# Patient Record
Sex: Female | Born: 1970 | Race: White | Hispanic: No | Marital: Single | Smoking: Never smoker
Health system: Southern US, Community
[De-identification: ages and names within clinical notes are randomized; demographics above are authoritative.]

## PROBLEM LIST (undated history)

## (undated) DIAGNOSIS — E288 Other ovarian dysfunction: Secondary | ICD-10-CM

## (undated) DIAGNOSIS — M722 Plantar fascial fibromatosis: Secondary | ICD-10-CM

## (undated) DIAGNOSIS — E2839 Other primary ovarian failure: Secondary | ICD-10-CM

## (undated) HISTORY — DX: Other primary ovarian failure: E28.39

## (undated) HISTORY — DX: Other ovarian dysfunction: E28.8

## (undated) HISTORY — DX: Plantar fascial fibromatosis: M72.2

## (undated) HISTORY — PX: COLONOSCOPY W/ BIOPSIES: SHX1374

---

## 1999-07-04 ENCOUNTER — Other Ambulatory Visit: Admission: RE | Admit: 1999-07-04 | Discharge: 1999-07-04 | Payer: Self-pay | Admitting: Obstetrics and Gynecology

## 2001-07-04 ENCOUNTER — Other Ambulatory Visit: Admission: RE | Admit: 2001-07-04 | Discharge: 2001-07-04 | Payer: Self-pay | Admitting: Obstetrics and Gynecology

## 2005-12-01 ENCOUNTER — Emergency Department (HOSPITAL_COMMUNITY): Admission: EM | Admit: 2005-12-01 | Discharge: 2005-12-01 | Payer: Self-pay | Admitting: Emergency Medicine

## 2005-12-05 ENCOUNTER — Emergency Department (HOSPITAL_COMMUNITY): Admission: EM | Admit: 2005-12-05 | Discharge: 2005-12-05 | Payer: Self-pay | Admitting: Emergency Medicine

## 2005-12-08 ENCOUNTER — Emergency Department (HOSPITAL_COMMUNITY): Admission: EM | Admit: 2005-12-08 | Discharge: 2005-12-08 | Payer: Self-pay | Admitting: Emergency Medicine

## 2005-12-12 ENCOUNTER — Encounter (HOSPITAL_COMMUNITY): Admission: RE | Admit: 2005-12-12 | Discharge: 2006-03-12 | Payer: Self-pay | Admitting: Emergency Medicine

## 2006-02-05 ENCOUNTER — Other Ambulatory Visit: Admission: RE | Admit: 2006-02-05 | Discharge: 2006-02-05 | Payer: Self-pay | Admitting: Obstetrics and Gynecology

## 2007-12-04 ENCOUNTER — Other Ambulatory Visit: Admission: RE | Admit: 2007-12-04 | Discharge: 2007-12-04 | Payer: Self-pay | Admitting: Obstetrics and Gynecology

## 2010-10-24 ENCOUNTER — Ambulatory Visit (INDEPENDENT_AMBULATORY_CARE_PROVIDER_SITE_OTHER): Payer: 59 | Admitting: Obstetrics and Gynecology

## 2010-10-24 DIAGNOSIS — Z01419 Encounter for gynecological examination (general) (routine) without abnormal findings: Secondary | ICD-10-CM

## 2010-10-30 ENCOUNTER — Other Ambulatory Visit: Payer: Self-pay | Admitting: Obstetrics and Gynecology

## 2010-10-30 DIAGNOSIS — Z1231 Encounter for screening mammogram for malignant neoplasm of breast: Secondary | ICD-10-CM

## 2010-11-07 ENCOUNTER — Ambulatory Visit (HOSPITAL_COMMUNITY)
Admission: RE | Admit: 2010-11-07 | Discharge: 2010-11-07 | Disposition: A | Discharge status: Home / Self Care | Payer: 59 | Source: Ambulatory Visit | Attending: Obstetrics and Gynecology | Admitting: Obstetrics and Gynecology

## 2010-11-07 DIAGNOSIS — Z1231 Encounter for screening mammogram for malignant neoplasm of breast: Secondary | ICD-10-CM | POA: Insufficient documentation

## 2011-03-19 ENCOUNTER — Ambulatory Visit (INDEPENDENT_AMBULATORY_CARE_PROVIDER_SITE_OTHER): Payer: 59 | Admitting: Obstetrics and Gynecology

## 2011-03-19 DIAGNOSIS — B373 Candidiasis of vulva and vagina: Secondary | ICD-10-CM

## 2011-03-19 DIAGNOSIS — N912 Amenorrhea, unspecified: Secondary | ICD-10-CM

## 2011-05-02 ENCOUNTER — Ambulatory Visit (INDEPENDENT_AMBULATORY_CARE_PROVIDER_SITE_OTHER): Payer: 59 | Admitting: Gynecology

## 2011-05-02 ENCOUNTER — Other Ambulatory Visit: Payer: Self-pay

## 2011-05-02 DIAGNOSIS — Z13820 Encounter for screening for osteoporosis: Secondary | ICD-10-CM

## 2011-05-22 ENCOUNTER — Encounter: Payer: Self-pay | Admitting: Obstetrics and Gynecology

## 2011-06-19 ENCOUNTER — Telehealth: Payer: Self-pay | Admitting: *Deleted

## 2011-06-19 NOTE — Telephone Encounter (Signed)
Note written for the patient. It'll be scanned into record.

## 2011-06-19 NOTE — Telephone Encounter (Signed)
Patient called wants to say that she wants to get a chair that is better for her back.  Right now the chair she has is not giving her any support.  She has used an item to help with support in the chair but has been unsuccessful.  She continues to have back pain.  She said she doesn't believe that she has a disability and not necessary to see a specialist.  She said she is just trying to go through [employer] to get a better chair.

## 2011-10-09 ENCOUNTER — Other Ambulatory Visit: Payer: Self-pay | Admitting: Obstetrics and Gynecology

## 2011-10-09 DIAGNOSIS — Z1231 Encounter for screening mammogram for malignant neoplasm of breast: Secondary | ICD-10-CM

## 2011-10-26 ENCOUNTER — Other Ambulatory Visit (HOSPITAL_COMMUNITY)
Admission: RE | Admit: 2011-10-26 | Discharge: 2011-10-26 | Disposition: A | Discharge status: Home / Self Care | Payer: 59 | Source: Ambulatory Visit | Attending: Obstetrics and Gynecology | Admitting: Obstetrics and Gynecology

## 2011-10-26 ENCOUNTER — Ambulatory Visit (INDEPENDENT_AMBULATORY_CARE_PROVIDER_SITE_OTHER): Payer: 59 | Admitting: Obstetrics and Gynecology

## 2011-10-26 ENCOUNTER — Encounter: Payer: Self-pay | Admitting: Obstetrics and Gynecology

## 2011-10-26 VITALS — BP 110/70 | Ht 66.0 in | Wt 140.0 lb

## 2011-10-26 DIAGNOSIS — Z833 Family history of diabetes mellitus: Secondary | ICD-10-CM

## 2011-10-26 DIAGNOSIS — Z01419 Encounter for gynecological examination (general) (routine) without abnormal findings: Secondary | ICD-10-CM

## 2011-10-26 NOTE — Progress Notes (Signed)
Patient came to see me today for her annual GYN exam. She is now menopausal. She is not symptomatic enough to require HRT. She is up-to-date on her mammograms. She is not sexually active. She is having no vaginal bleeding. She is having no pelvic pain. She had a normal bone density last year.  Physical examination: HEENT within normal limits. Neck: Thyroid not large. No masses. Supraclavicular nodes: not enlarged. Breasts: Examined in both sitting midline position. No skin changes and no masses. Abdomen: Soft no guarding rebound or masses or hernia. Pelvic: External: Within normal limits. BUS: Within normal limits. Vaginal:within normal limits. Admits one finger breath snugly. Good estrogen effect. No evidence of cystocele rectocele or enterocele. Cervix: clean. Uterus: Normal size and shape. Adnexa: No masses. Rectovaginal exam: Confirmatory and negative. Extremities: Within normal limits.  Assessment: Normal GYN exam  Plan: Continue yearly mammograms

## 2011-10-27 LAB — URINALYSIS W MICROSCOPIC + REFLEX CULTURE
Crystals: NONE SEEN
Glucose, UA: NEGATIVE mg/dL
Protein, ur: NEGATIVE mg/dL
Specific Gravity, Urine: 1.013 (ref 1.005–1.030)

## 2011-10-28 LAB — URINE CULTURE
Colony Count: NO GROWTH
Organism ID, Bacteria: NO GROWTH

## 2011-11-08 ENCOUNTER — Ambulatory Visit (HOSPITAL_COMMUNITY)
Admission: RE | Admit: 2011-11-08 | Discharge: 2011-11-08 | Disposition: A | Discharge status: Home / Self Care | Payer: 59 | Source: Ambulatory Visit | Attending: Obstetrics and Gynecology | Admitting: Obstetrics and Gynecology

## 2011-11-08 DIAGNOSIS — Z1231 Encounter for screening mammogram for malignant neoplasm of breast: Secondary | ICD-10-CM | POA: Insufficient documentation

## 2013-05-06 ENCOUNTER — Other Ambulatory Visit: Payer: Self-pay | Admitting: Gynecology

## 2013-05-06 DIAGNOSIS — Z13820 Encounter for screening for osteoporosis: Secondary | ICD-10-CM

## 2013-06-11 ENCOUNTER — Ambulatory Visit (INDEPENDENT_AMBULATORY_CARE_PROVIDER_SITE_OTHER): Payer: 59 | Admitting: Gynecology

## 2013-06-11 ENCOUNTER — Encounter: Payer: Self-pay | Admitting: Gynecology

## 2013-06-11 VITALS — BP 112/74 | Ht 66.0 in | Wt 140.0 lb

## 2013-06-11 DIAGNOSIS — E288 Other ovarian dysfunction: Secondary | ICD-10-CM

## 2013-06-11 DIAGNOSIS — Z01419 Encounter for gynecological examination (general) (routine) without abnormal findings: Secondary | ICD-10-CM

## 2013-06-11 NOTE — Progress Notes (Signed)
Kari Powell Jan 31, 1971 [id number]        42 y.o.  G0P0 for annual exam.  Former patient of Dr. Eda Paschal. Several issues noted below.  Past medical history,surgical history, medications, allergies, family history and social history were all reviewed and documented in the EPIC chart.  ROS:  Performed and pertinent positives and negatives are included in the history, assessment and plan .  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 Pelvic  Ext/BUS/vagina  Normal with virginal introitus  Cervix  Normal to limited inspection Pap/HPV done.  Uterus  anteverted, normal size, shape and contour, midline and mobile nontender   Adnexa  Without masses or tenderness    Anus and perineum  normal   Rectovaginal  normal sphincter tone without palpated masses or tenderness.    Assessment/Plan:  42 y.o. G0P0 female for annual exam.   1. Premature menopause with no menses/bleeding. Virginal status. Exam normal. Reviewed issues of HRT particularly with premature menopause and the issues of cardioprotection and bone health.  The WHI study with increased risks of thrombosis such as stroke heart attack DVT and breast cancer reviewed. Different population studied and may not be applicable to premature menopause also discussed.  She is asymptomatic without hot flushes night sweats or vaginal dryness. Patient prefers not to take HRT at this time understanding the potential benefits as outlined above. 2. Pap smear 2013 normal but did not have endocervical cells. Pap/HPV repeated this year. 3. Mammography  scheduled next week. SBE monthly reviewed. 4. DEXA 2012 normal. Plan repeat at 48. Increase calcium vitamin D reviewed. 5. Health maintenance. Patient reports routine blood work done through screening program at work to include glucose lipid profile and other blood work. No blood work done today. Followup one year, sooner as needed.   Note: This document was prepared with digital dictation and possible smart phrase technology. Any transcriptional errors that result from this process are unintentional.   Kari Lords MD, 4:38 PM 06/11/2013

## 2013-06-11 NOTE — Patient Instructions (Signed)
followup in one year for annual exam. Sooner if any issues.

## 2013-06-12 LAB — URINALYSIS W MICROSCOPIC + REFLEX CULTURE
Casts: NONE SEEN
Glucose, UA: NEGATIVE mg/dL
Ketones, ur: NEGATIVE mg/dL
Nitrite: NEGATIVE
pH: 6 (ref 5.0–8.0)

## 2013-06-13 LAB — URINE CULTURE
Colony Count: NO GROWTH
Organism ID, Bacteria: NO GROWTH

## 2013-06-16 ENCOUNTER — Ambulatory Visit (INDEPENDENT_AMBULATORY_CARE_PROVIDER_SITE_OTHER): Payer: 59

## 2013-06-16 DIAGNOSIS — Z13820 Encounter for screening for osteoporosis: Secondary | ICD-10-CM

## 2013-07-09 ENCOUNTER — Other Ambulatory Visit: Payer: Self-pay

## 2014-06-01 ENCOUNTER — Other Ambulatory Visit: Payer: Self-pay | Admitting: Gynecology

## 2014-06-01 DIAGNOSIS — Z1231 Encounter for screening mammogram for malignant neoplasm of breast: Secondary | ICD-10-CM

## 2014-06-10 ENCOUNTER — Ambulatory Visit (HOSPITAL_COMMUNITY)
Admission: RE | Admit: 2014-06-10 | Discharge: 2014-06-10 | Disposition: A | Discharge status: Home / Self Care | Payer: 59 | Source: Ambulatory Visit | Attending: Gynecology | Admitting: Gynecology

## 2014-06-10 DIAGNOSIS — Z1231 Encounter for screening mammogram for malignant neoplasm of breast: Secondary | ICD-10-CM | POA: Insufficient documentation

## 2014-06-11 ENCOUNTER — Other Ambulatory Visit: Payer: Self-pay

## 2014-06-11 ENCOUNTER — Other Ambulatory Visit: Payer: Self-pay | Admitting: Gynecology

## 2014-06-11 DIAGNOSIS — R928 Other abnormal and inconclusive findings on diagnostic imaging of breast: Secondary | ICD-10-CM

## 2014-06-23 ENCOUNTER — Other Ambulatory Visit: Payer: Self-pay

## 2014-06-23 ENCOUNTER — Other Ambulatory Visit: Payer: Self-pay | Admitting: Gynecology

## 2014-06-23 DIAGNOSIS — R928 Other abnormal and inconclusive findings on diagnostic imaging of breast: Secondary | ICD-10-CM

## 2014-06-24 ENCOUNTER — Ambulatory Visit
Admission: RE | Admit: 2014-06-24 | Discharge: 2014-06-24 | Disposition: A | Discharge status: Home / Self Care | Payer: 59 | Source: Ambulatory Visit | Attending: Gynecology | Admitting: Gynecology

## 2014-06-24 DIAGNOSIS — R928 Other abnormal and inconclusive findings on diagnostic imaging of breast: Secondary | ICD-10-CM

## 2014-06-28 ENCOUNTER — Other Ambulatory Visit: Payer: 59

## 2014-07-13 ENCOUNTER — Ambulatory Visit (INDEPENDENT_AMBULATORY_CARE_PROVIDER_SITE_OTHER): Payer: 59 | Admitting: Gynecology

## 2014-07-13 ENCOUNTER — Encounter: Payer: Self-pay | Admitting: Gynecology

## 2014-07-13 VITALS — BP 112/70 | Ht 66.0 in | Wt 133.0 lb

## 2014-07-13 DIAGNOSIS — E28319 Asymptomatic premature menopause: Secondary | ICD-10-CM

## 2014-07-13 DIAGNOSIS — Z01419 Encounter for gynecological examination (general) (routine) without abnormal findings: Secondary | ICD-10-CM

## 2014-07-13 NOTE — Progress Notes (Signed)
Kari Powell Jun 06, 1971 [id number]        43 y.o.  G0P0 for annual exam.  Several issues noted below.  Past medical history,surgical history, problem list, medications, allergies, family history and social history were all reviewed and documented as reviewed in the EPIC chart.  ROS:  12 system ROS performed with pertinent positives and negatives included in the history, assessment and plan.   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 Skin: Grossly normal HEENT: Without gross lesions.  No cervical or supraclavicular adenopathy. Thyroid normal.  Lungs:  Clear without wheezing, rales or rhonchi Cardiac: RR, without RMG Abdominal:  Soft, nontender, without masses, guarding, rebound, organomegaly or hernia Breasts:  Examined lying and sitting without masses, retractions, discharge or axillary adenopathy. Pelvic:  Ext/BUS/vagina virginal admitting small finger only  Cervix difficult to visualize. Pap/HPV done  Uterus axial to anteverted, grossly normal size, shape and contour, midline and mobile nontender   Adnexa  Without masses or tenderness    Anus and perineum  Normal   Rectovaginal  Normal sphincter tone without palpated masses or tenderness.    Assessment/Plan:  43 y.o. G0P0 female for annual exam.   1. History of premature menopause several years ago per Dr. Verl Powell's workup. Patient without significant symptoms of hot flashes night sweats vaginal dryness. Remains virginal. Will recheck her FSH/TSH. Again rediscussed HRT and possible benefits in patients with premature menopause. Patient is not interested in starting and prefers just to monitor. No vaginal bleeding at all. Nose report any vaginal bleeding. 2. Pap smear last year returned inadequate. I did another Pap/HPV today. Exam is somewhat  limited by her virginal status and pain with exam. No gross abnormalities on exam. 3. Mammography 06/2014. Continue with annual mammography. SBE monthly reviewed. 4. DEXA 2014 normal. Plan repeat at 5-10 year interval. Increase calcium vitamin D reviewed MD level today. 5. Health maintenance. Baseline CBC comprehensive metabolic panel lipid profile urinalysis TSH FSH ordered.  Follow up if any issues.     Kari Powell,Kari Leiter P MD, 4:24 PM 07/13/2014

## 2014-07-13 NOTE — Addendum Note (Signed)
Addended by: Dayna BarkerGARDNER, Pleas Carneal K on: 07/13/2014 04:42 PM   Modules accepted: Orders

## 2014-07-13 NOTE — Patient Instructions (Signed)
You may obtain a copy of any labs that were done today by logging onto MyChart as outlined in the instructions provided with your AVS (after visit summary). The office will not call with normal lab results but certainly if there are any significant abnormalities then we will contact you.   Health Maintenance, Female A healthy lifestyle and preventative care can promote health and wellness.  Maintain regular health, dental, and eye exams.  Eat a healthy diet. Foods like vegetables, fruits, whole grains, low-fat dairy products, and lean protein foods contain the nutrients you need without too many calories. Decrease your intake of foods high in solid fats, added sugars, and salt. Get information about a proper diet from your caregiver, if necessary.  Regular physical exercise is one of the most important things you can do for your health. Most adults should get at least 150 minutes of moderate-intensity exercise (any activity that increases your heart rate and causes you to sweat) each week. In addition, most adults need muscle-strengthening exercises on 2 or more days a week.   Maintain a healthy weight. The body mass index (BMI) is a screening tool to identify possible weight problems. It provides an estimate of body fat based on height and weight. Your caregiver can help determine your BMI, and can help you achieve or maintain a healthy weight. For adults 20 years and older:  A BMI below 18.5 is considered underweight.  A BMI of 18.5 to 24.9 is normal.  A BMI of 25 to 29.9 is considered overweight.  A BMI of 30 and above is considered obese.  Maintain normal blood lipids and cholesterol by exercising and minimizing your intake of saturated fat. Eat a balanced diet with plenty of fruits and vegetables. Blood tests for lipids and cholesterol should begin at age 61 and be repeated every 5 years. If your lipid or cholesterol levels are high, you are over 50, or you are a high risk for heart  disease, you may need your cholesterol levels checked more frequently.Ongoing high lipid and cholesterol levels should be treated with medicines if diet and exercise are not effective.  If you smoke, find out from your caregiver how to quit. If you do not use tobacco, do not start.  Lung cancer screening is recommended for adults aged 33 80 years who are at high risk for developing lung cancer because of a history of smoking. Yearly low-dose computed tomography (CT) is recommended for people who have at least a 30-pack-year history of smoking and are a current smoker or have quit within the past 15 years. A pack year of smoking is smoking an average of 1 pack of cigarettes a day for 1 year (for example: 1 pack a day for 30 years or 2 packs a day for 15 years). Yearly screening should continue until the smoker has stopped smoking for at least 15 years. Yearly screening should also be stopped for people who develop a health problem that would prevent them from having lung cancer treatment.  If you are pregnant, do not drink alcohol. If you are breastfeeding, be very cautious about drinking alcohol. If you are not pregnant and choose to drink alcohol, do not exceed 1 drink per day. One drink is considered to be 12 ounces (355 mL) of beer, 5 ounces (148 mL) of wine, or 1.5 ounces (44 mL) of liquor.  Avoid use of street drugs. Do not share needles with anyone. Ask for help if you need support or instructions about stopping  the use of drugs.  High blood pressure causes heart disease and increases the risk of stroke. Blood pressure should be checked at least every 1 to 2 years. Ongoing high blood pressure should be treated with medicines, if weight loss and exercise are not effective.  If you are 59 to 45 years old, ask your caregiver if you should take aspirin to prevent strokes.  Diabetes screening involves taking a blood sample to check your fasting blood sugar level. This should be done once every 3  years, after age 91, if you are within normal weight and without risk factors for diabetes. Testing should be considered at a younger age or be carried out more frequently if you are overweight and have at least 1 risk factor for diabetes.  Breast cancer screening is essential preventative care for women. You should practice "breast self-awareness." This means understanding the normal appearance and feel of your breasts and may include breast self-examination. Any changes detected, no matter how small, should be reported to a caregiver. Women in their 66s and 30s should have a clinical breast exam (CBE) by a caregiver as part of a regular health exam every 1 to 3 years. After age 101, women should have a CBE every year. Starting at age 100, women should consider having a mammogram (breast X-ray) every year. Women who have a family history of breast cancer should talk to their caregiver about genetic screening. Women at a high risk of breast cancer should talk to their caregiver about having an MRI and a mammogram every year.  Breast cancer gene (BRCA)-related cancer risk assessment is recommended for women who have family members with BRCA-related cancers. BRCA-related cancers include breast, ovarian, tubal, and peritoneal cancers. Having family members with these cancers may be associated with an increased risk for harmful changes (mutations) in the breast cancer genes BRCA1 and BRCA2. Results of the assessment will determine the need for genetic counseling and BRCA1 and BRCA2 testing.  The Pap test is a screening test for cervical cancer. Women should have a Pap test starting at age 57. Between ages 25 and 35, Pap tests should be repeated every 2 years. Beginning at age 37, you should have a Pap test every 3 years as long as the past 3 Pap tests have been normal. If you had a hysterectomy for a problem that was not cancer or a condition that could lead to cancer, then you no longer need Pap tests. If you are  between ages 50 and 76, and you have had normal Pap tests going back 10 years, you no longer need Pap tests. If you have had past treatment for cervical cancer or a condition that could lead to cancer, you need Pap tests and screening for cancer for at least 20 years after your treatment. If Pap tests have been discontinued, risk factors (such as a new sexual partner) need to be reassessed to determine if screening should be resumed. Some women have medical problems that increase the chance of getting cervical cancer. In these cases, your caregiver may recommend more frequent screening and Pap tests.  The human papillomavirus (HPV) test is an additional test that may be used for cervical cancer screening. The HPV test looks for the virus that can cause the cell changes on the cervix. The cells collected during the Pap test can be tested for HPV. The HPV test could be used to screen women aged 44 years and older, and should be used in women of any age  who have unclear Pap test results. After the age of 55, women should have HPV testing at the same frequency as a Pap test.  Colorectal cancer can be detected and often prevented. Most routine colorectal cancer screening begins at the age of 44 and continues through age 20. However, your caregiver may recommend screening at an earlier age if you have risk factors for colon cancer. On a yearly basis, your caregiver may provide home test kits to check for hidden blood in the stool. Use of a small camera at the end of a tube, to directly examine the colon (sigmoidoscopy or colonoscopy), can detect the earliest forms of colorectal cancer. Talk to your caregiver about this at age 86, when routine screening begins. Direct examination of the colon should be repeated every 5 to 10 years through age 13, unless early forms of pre-cancerous polyps or small growths are found.  Hepatitis C blood testing is recommended for all people born from [birth year] through 1967 and any  individual with known risks for hepatitis C.  Practice safe sex. Use condoms and avoid high-risk sexual practices to reduce the spread of sexually transmitted infections (STIs). Sexually active women aged 36 and younger should be checked for Chlamydia, which is a common sexually transmitted infection. Older women with new or multiple partners should also be tested for Chlamydia. Testing for other STIs is recommended if you are sexually active and at increased risk.  Osteoporosis is a disease in which the bones lose minerals and strength with aging. This can result in serious bone fractures. The risk of osteoporosis can be identified using a bone density scan. Women ages 20 and over and women at risk for fractures or osteoporosis should discuss screening with their caregivers. Ask your caregiver whether you should be taking a calcium supplement or vitamin D to reduce the rate of osteoporosis.  Menopause can be associated with physical symptoms and risks. Hormone replacement therapy is available to decrease symptoms and risks. You should talk to your caregiver about whether hormone replacement therapy is right for you.  Use sunscreen. Apply sunscreen liberally and repeatedly throughout the day. You should seek shade when your shadow is shorter than you. Protect yourself by wearing long sleeves, pants, a wide-brimmed hat, and sunglasses year round, whenever you are outdoors.  Notify your caregiver of new moles or changes in moles, especially if there is a change in shape or color. Also notify your caregiver if a mole is larger than the size of a pencil eraser.  Stay current with your immunizations. Document Released: 03/05/2011 Document Revised: 12/15/2012 Document Reviewed: 03/05/2011 [facility] Patient Information 2014 [employer].

## 2014-07-16 ENCOUNTER — Other Ambulatory Visit: Payer: Self-pay | Admitting: Gynecology

## 2014-07-16 DIAGNOSIS — E7800 Pure hypercholesterolemia, unspecified: Secondary | ICD-10-CM

## 2014-07-16 DIAGNOSIS — R3129 Other microscopic hematuria: Secondary | ICD-10-CM

## 2014-07-19 ENCOUNTER — Other Ambulatory Visit: Payer: 59

## 2014-07-19 ENCOUNTER — Telehealth: Payer: Self-pay | Admitting: *Deleted

## 2014-07-19 NOTE — Telephone Encounter (Signed)
Pt called requesting a call back to get results on 07/13/14 I called and left the information Kari Powell spoke with patient as well on voicemail.

## 2014-07-20 ENCOUNTER — Other Ambulatory Visit: Payer: 59

## 2014-07-20 DIAGNOSIS — E7800 Pure hypercholesterolemia, unspecified: Secondary | ICD-10-CM

## 2014-07-20 DIAGNOSIS — R3129 Other microscopic hematuria: Secondary | ICD-10-CM

## 2014-07-20 LAB — LIPID PANEL
CHOL/HDL RATIO: 3.3 ratio
Cholesterol: 196 mg/dL (ref 0–200)
HDL: 59 mg/dL (ref >39)
LDL Cholesterol: 117 mg/dL — ABNORMAL HIGH (ref 0–99)
Triglycerides: 98 mg/dL (ref <150)
VLDL: 20 mg/dL (ref 0–40)

## 2014-07-21 LAB — URINALYSIS W MICROSCOPIC + REFLEX CULTURE
BILIRUBIN URINE: NEGATIVE
Bacteria, UA: NONE SEEN
CRYSTALS: NONE SEEN
Casts: NONE SEEN
GLUCOSE, UA: NEGATIVE mg/dL
Hgb urine dipstick: NEGATIVE
Ketones, ur: NEGATIVE mg/dL
Nitrite: NEGATIVE
Protein, ur: NEGATIVE mg/dL
SPECIFIC GRAVITY, URINE: 1.007 (ref 1.005–1.030)
SQUAMOUS EPITHELIAL / LPF: NONE SEEN
UROBILINOGEN UA: 0.2 mg/dL (ref 0.0–1.0)
pH: 7.5 (ref 5.0–8.0)

## 2014-07-22 LAB — URINE CULTURE: Colony Count: 30000

## 2014-07-23 ENCOUNTER — Other Ambulatory Visit: Payer: Self-pay | Admitting: Gynecology

## 2014-07-23 MED ORDER — CIPROFLOXACIN HCL 250 MG PO TABS: 250.0000 mg | ORAL_TABLET | Freq: Two times a day (BID) | ORAL | Status: DC

## 2014-07-26 ENCOUNTER — Other Ambulatory Visit: Payer: Self-pay | Admitting: Gynecology

## 2014-07-26 ENCOUNTER — Other Ambulatory Visit: Payer: 59

## 2014-07-26 ENCOUNTER — Telehealth: Payer: Self-pay

## 2014-07-26 DIAGNOSIS — N3000 Acute cystitis without hematuria: Secondary | ICD-10-CM

## 2014-07-26 LAB — URINALYSIS W MICROSCOPIC + REFLEX CULTURE
Bilirubin Urine: NEGATIVE
CASTS: NONE SEEN
CRYSTALS: NONE SEEN
GLUCOSE, UA: NEGATIVE mg/dL
Ketones, ur: NEGATIVE mg/dL
Leukocytes, UA: NEGATIVE
NITRITE: NEGATIVE
PH: 6 (ref 5.0–8.0)
Protein, ur: NEGATIVE mg/dL
Specific Gravity, Urine: <1.005 — ABNORMAL LOW (ref 1.005–1.030)
Urobilinogen, UA: 0.2 mg/dL (ref 0.0–1.0)

## 2014-07-26 NOTE — Telephone Encounter (Signed)
I would recommend repeating a clean-catch urine analysis and culture

## 2014-07-26 NOTE — Telephone Encounter (Signed)
Specifically order culture also

## 2014-07-26 NOTE — Telephone Encounter (Signed)
Left detailed message for patient to call to schedule lab appt. Order placed.

## 2014-07-26 NOTE — Telephone Encounter (Signed)
Patient said she took the Cipro for UTI found on u/a.  She said at the time she did not have symptoms but over the weekend while taking the medication she noticed a little bit of urgency andsome "discharge on my underwear".  Her biggest concern is that the Cipro has not treated the infection "and it could turn into something more serious". She wanted to see what you recommended?

## 2014-07-26 NOTE — Telephone Encounter (Signed)
Ordered accordingly. Checked with nicole.

## 2014-07-26 NOTE — Telephone Encounter (Signed)
Dr. Velvet BatheF- I ordered urinalysis with culture reflex.  Is that what you wanted or should I specifically order a urine culture.  Patient had called back stating she wanted a urine culture and sensitivity to be done.

## 2014-07-27 LAB — URINE CULTURE
COLONY COUNT: NO GROWTH
Organism ID, Bacteria: NO GROWTH

## 2014-07-28 ENCOUNTER — Telehealth: Payer: Self-pay

## 2014-07-28 NOTE — Telephone Encounter (Signed)
Patient repeated her u/a after taking antibiotic and wondered if results were final. Looks like culture was negative but u/a had "hgb urine dipstick=trace".  What to tell her?

## 2014-07-28 NOTE — Telephone Encounter (Signed)
Tell patient infection has cleared. Nothing further needs to be done

## 2014-07-28 NOTE — Telephone Encounter (Signed)
Left detailed message on her voice mail.

## 2014-12-14 ENCOUNTER — Telehealth: Payer: Self-pay | Admitting: *Deleted

## 2014-12-14 NOTE — Telephone Encounter (Signed)
[facility]

## 2014-12-14 NOTE — Telephone Encounter (Signed)
Left the below on voicemail with #

## 2014-12-14 NOTE — Telephone Encounter (Signed)
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 Pt would like recommendations for a provider to see for this. Please advise

## 2015-07-15 ENCOUNTER — Other Ambulatory Visit: Payer: Self-pay

## 2015-07-15 ENCOUNTER — Ambulatory Visit (INDEPENDENT_AMBULATORY_CARE_PROVIDER_SITE_OTHER): Payer: 59 | Admitting: Gynecology

## 2015-07-15 ENCOUNTER — Encounter: Payer: Self-pay | Admitting: Gynecology

## 2015-07-15 ENCOUNTER — Other Ambulatory Visit (HOSPITAL_COMMUNITY)
Admission: RE | Admit: 2015-07-15 | Discharge: 2015-07-15 | Disposition: A | Discharge status: Home / Self Care | Payer: 59 | Source: Ambulatory Visit | Attending: Gynecology | Admitting: Gynecology

## 2015-07-15 VITALS — BP 110/66 | Ht 66.0 in | Wt 132.0 lb

## 2015-07-15 DIAGNOSIS — E28319 Asymptomatic premature menopause: Secondary | ICD-10-CM

## 2015-07-15 DIAGNOSIS — Z1231 Encounter for screening mammogram for malignant neoplasm of breast: Secondary | ICD-10-CM

## 2015-07-15 DIAGNOSIS — Z01419 Encounter for gynecological examination (general) (routine) without abnormal findings: Secondary | ICD-10-CM | POA: Insufficient documentation

## 2015-07-15 NOTE — Patient Instructions (Signed)
Call to Schedule your mammogram  Facilities in [place]: 1)  [facility], [place] Phone: [phone] 2)  [facility]. [address] [place] Phone: [phone] 3)  Dr. Isaiah Blakes at [facility] [address] Phone: [phone]     Mammogram A mammogram is an X-ray test to find changes in a woman's breast. You should get a mammogram if:  You are 46 years of age or older  You have risk factors.   Your doctor recommends that you have one.  BEFORE THE TEST  Do not schedule the test the week before your period, especially if your breasts are sore during this time.  On the day of your mammogram:  Wash your breasts and armpits well. After washing, do not put on any deodorant or talcum powder on until after your test.   Eat and drink as you usually do.   Take your medicines as usual.   If you are diabetic and take insulin, make sure you:   Eat before coming for your test.   Take your insulin as usual.   If you cannot keep your appointment, call before the appointment to cancel. Schedule another appointment.  TEST  You will need to undress from the waist up. You will put on a hospital gown.   Your breast will be put on the mammogram machine, and it will press firmly on your breast with a piece of plastic called a compression paddle. This will make your breast flatter so that the machine can X-ray all parts of your breast.   Both breasts will be X-rayed. Each breast will be X-rayed from above and from the side. An X-ray might need to be taken again if the picture is not good enough.   The mammogram will last about 15 to 30 minutes.  AFTER THE TEST Finding out the results of your test Ask when your test results will be ready. Make sure you get your test results.  Document Released: 11/16/2008 Document Revised: 08/09/2011 Document Reviewed: 11/16/2008 [facility] Patient  Information 2012 [place].    You may obtain a copy of any labs that were done today by logging onto MyChart as outlined in the instructions provided with your AVS (after visit summary). The office will not call with normal lab results but certainly if there are any significant abnormalities then we will contact you.   Health Maintenance Adopting a healthy lifestyle and getting preventive care can go a long way to promote health and wellness. Talk with your health care provider about what schedule of regular examinations is right for you. This is a good chance for you to check in with your provider about disease prevention and staying healthy. In between checkups, there are plenty of things you can do on your own. Experts have done a lot of research about which lifestyle changes and preventive measures are most likely to keep you healthy. Ask your health care provider for more information. WEIGHT AND DIET  Eat a healthy diet  Be sure to include plenty of vegetables, fruits, low-fat dairy products, and lean protein.  Do not eat a lot of foods high in solid fats, added sugars, or salt.  Get regular exercise. This is one of the most important things you can do for your health. 1. Most adults should exercise for at least 150 minutes each week. The exercise should increase your heart rate and make you sweat (  moderate-intensity exercise). 2. Most adults should also do strengthening exercises at least twice a week. This is in addition to the moderate-intensity exercise.  Maintain a healthy weight  Body mass index (BMI) is a measurement that can be used to identify possible weight problems. It estimates body fat based on height and weight. Your health care provider can help determine your BMI and help you achieve or maintain a healthy weight.  For females 7 years of age and older:  1. A BMI below 18.5 is considered underweight. 2. A BMI of 18.5 to 24.9 is normal. 3. A BMI of 25 to 29.9 is  considered overweight. 4. A BMI of 30 and above is considered obese.  Watch levels of cholesterol and blood lipids  You should start having your blood tested for lipids and cholesterol at 46 years of age, then have this test every 5 years.  You may need to have your cholesterol levels checked more often if: 1. Your lipid or cholesterol levels are high. 2. You are older than 46 years of age. 3. You are at high risk for heart disease.  CANCER SCREENING   Lung Cancer  Lung cancer screening is recommended for adults 55-74 years old who are at high risk for lung cancer because of a history of smoking.  A yearly low-dose CT scan of the lungs is recommended for people who: 1. Currently smoke. 2. Have quit within the past 15 years. 3. Have at least a 30-pack-year history of smoking. A pack year is smoking an average of one pack of cigarettes a day for 1 year.  Yearly screening should continue until it has been 15 years since you quit.  Yearly screening should stop if you develop a health problem that would prevent you from having lung cancer treatment.  Breast Cancer  Practice breast self-awareness. This means understanding how your breasts normally appear and feel.  It also means doing regular breast self-exams. Let your health care provider know about any changes, no matter how small.  If you are in your 20s or 30s, you should have a clinical breast exam (CBE) by a health care provider every 1-3 years as part of a regular health exam.  If you are 1 or older, have a CBE every year. Also consider having a breast X-ray (mammogram) every year.  If you have a family history of breast cancer, talk to your health care provider about genetic screening.  If you are at high risk for breast cancer, talk to your health care provider about having an MRI and a mammogram every year.  Breast cancer gene (BRCA) assessment is recommended for women who have family members with BRCA-related cancers.  BRCA-related cancers include:  Breast.  Ovarian.  Tubal.  Peritoneal cancers.  Results of the assessment will determine the need for genetic counseling and BRCA1 and BRCA2 testing. Cervical Cancer Routine pelvic examinations to screen for cervical cancer are no longer recommended for nonpregnant women who are considered low risk for cancer of the pelvic organs (ovaries, uterus, and vagina) and who do not have symptoms. A pelvic examination may be necessary if you have symptoms including those associated with pelvic infections. Ask your health care provider if a screening pelvic exam is right for you.   The Pap test is the screening test for cervical cancer for women who are considered at risk.  If you had a hysterectomy for a problem that was not cancer or a condition that could lead to cancer, then you  no longer need Pap tests.  If you are older than 65 years, and you have had normal Pap tests for the past 10 years, you no longer need to have Pap tests.  If you have had past treatment for cervical cancer or a condition that could lead to cancer, you need Pap tests and screening for cancer for at least 20 years after your treatment.  If you no longer get a Pap test, assess your risk factors if they change (such as having a new sexual partner). This can affect whether you should start being screened again.  Some women have medical problems that increase their chance of getting cervical cancer. If this is the case for you, your health care provider may recommend more frequent screening and Pap tests.  The human papillomavirus (HPV) test is another test that may be used for cervical cancer screening. The HPV test looks for the virus that can cause cell changes in the cervix. The cells collected during the Pap test can be tested for HPV.  The HPV test can be used to screen women 58 years of age and older. Getting tested for HPV can extend the interval between normal Pap tests from three to  five years.  An HPV test also should be used to screen women of any age who have unclear Pap test results.  After 46 years of age, women should have HPV testing as often as Pap tests.  Colorectal Cancer  This type of cancer can be detected and often prevented.  Routine colorectal cancer screening usually begins at 46 years of age and continues through 46 years of age.  Your health care provider may recommend screening at an earlier age if you have risk factors for colon cancer.  Your health care provider may also recommend using home test kits to check for hidden blood in the stool.  A small camera at the end of a tube can be used to examine your colon directly (sigmoidoscopy or colonoscopy). This is done to check for the earliest forms of colorectal cancer.  Routine screening usually begins at age 60.  Direct examination of the colon should be repeated every 5-10 years through 46 years of age. However, you may need to be screened more often if early forms of precancerous polyps or small growths are found. Skin Cancer  Check your skin from head to toe regularly.  Tell your health care provider about any new moles or changes in moles, especially if there is a change in a mole's shape or color.  Also tell your health care provider if you have a mole that is larger than the size of a pencil eraser.  Always use sunscreen. Apply sunscreen liberally and repeatedly throughout the day.  Protect yourself by wearing long sleeves, pants, a wide-brimmed hat, and sunglasses whenever you are outside. HEART DISEASE, DIABETES, AND HIGH BLOOD PRESSURE   Have your blood pressure checked at least every 1-2 years. High blood pressure causes heart disease and increases the risk of stroke.  If you are between 49 years and 72 years old, ask your health care provider if you should take aspirin to prevent strokes.  Have regular diabetes screenings. This involves taking a blood sample to check your  fasting blood sugar level.  If you are at a normal weight and have a low risk for diabetes, have this test once every three years after 46 years of age.  If you are overweight and have a high risk for diabetes, consider  being tested at a younger age or more often. PREVENTING INFECTION  Hepatitis B  If you have a higher risk for hepatitis B, you should be screened for this virus. You are considered at high risk for hepatitis B if:  You were born in a country where hepatitis B is common. Ask your health care provider which countries are considered high risk.  Your parents were born in a high-risk country, and you have not been immunized against hepatitis B (hepatitis B vaccine).  You have HIV or AIDS.  You use needles to inject street drugs.  You live with someone who has hepatitis B.  You have had sex with someone who has hepatitis B.  You get hemodialysis treatment.  You take certain medicines for conditions, including cancer, organ transplantation, and autoimmune conditions. Hepatitis C  Blood testing is recommended for:  Everyone born from [birth year] through 1967.  Anyone with known risk factors for hepatitis C. Sexually transmitted infections (STIs)  You should be screened for sexually transmitted infections (STIs) including gonorrhea and chlamydia if:  You are sexually active and are younger than 46 years of age.  You are older than 46 years of age and your health care provider tells you that you are at risk for this type of infection.  Your sexual activity has changed since you were last screened and you are at an increased risk for chlamydia or gonorrhea. Ask your health care provider if you are at risk.  If you do not have HIV, but are at risk, it may be recommended that you take a prescription medicine daily to prevent HIV infection. This is called pre-exposure prophylaxis (PrEP). You are considered at risk if:  You are sexually active and do not regularly use condoms or  know the HIV status of your partner(s).  You take drugs by injection.  You are sexually active with a partner who has HIV. Talk with your health care provider about whether you are at high risk of being infected with HIV. If you choose to begin PrEP, you should first be tested for HIV. You should then be tested every 3 months for as long as you are taking PrEP.  PREGNANCY   If you are premenopausal and you may become pregnant, ask your health care provider about preconception counseling.  If you may become pregnant, take 400 to 800 micrograms (mcg) of folic acid every day.  If you want to prevent pregnancy, talk to your health care provider about birth control (contraception). OSTEOPOROSIS AND MENOPAUSE   Osteoporosis is a disease in which the bones lose minerals and strength with aging. This can result in serious bone fractures. Your risk for osteoporosis can be identified using a bone density scan.  If you are 43 years of age or older, or if you are at risk for osteoporosis and fractures, ask your health care provider if you should be screened.  Ask your health care provider whether you should take a calcium or vitamin D supplement to lower your risk for osteoporosis.  Menopause may have certain physical symptoms and risks.  Hormone replacement therapy may reduce some of these symptoms and risks. Talk to your health care provider about whether hormone replacement therapy is right for you.  HOME CARE INSTRUCTIONS   Schedule regular health, dental, and eye exams.  Stay current with your immunizations.   Do not use any tobacco products including cigarettes, chewing tobacco, or electronic cigarettes.  If you are pregnant, do not drink alcohol.  If  you are breastfeeding, limit how much and how often you drink alcohol.  Limit alcohol intake to no more than 1 drink per day for nonpregnant women. One drink equals 12 ounces of beer, 5 ounces of wine, or 1 ounces of hard liquor.  Do  not use street drugs.  Do not share needles.  Ask your health care provider for help if you need support or information about quitting drugs.  Tell your health care provider if you often feel depressed.  Tell your health care provider if you have ever been abused or do not feel safe at home. Document Released: 03/05/2011 Document Revised: 01/04/2014 Document Reviewed: 07/22/2013 [facility] Patient Information 2015 [place], [place]. This information is not intended to replace advice given to you by your health care provider. Make sure you discuss any questions you have with your health care provider.

## 2015-07-15 NOTE — Progress Notes (Signed)
Kari HeronRene M Powell 22-Apr-1971 [id number]        44 y.o.  G0P0  Patient's last menstrual period was 10/30/2010. for annual exam.  Doing well without complaints  Past medical history,surgical history, problem list, medications, allergies, family history and social history were all reviewed and documented as reviewed in the EPIC chart.  ROS:  Performed with pertinent positives and negatives included in the history, assessment and plan.   Additional significant findings :  none   Exam: [facility] personassistant Filed Vitals:   07/15/15 1559  BP: 110/66  Height: 5\' 6"  (1.676 m)  Weight: 132 lb (59.875 kg)   General appearance:  Normal affect, orientation and appearance. Skin: Grossly normal HEENT: Without gross lesions.  No cervical or supraclavicular adenopathy. Thyroid normal.  Lungs:  Clear without wheezing, rales or rhonchi Cardiac: RR, without RMG Abdominal:  Soft, nontender, without masses, guarding, rebound, organomegaly or hernia Breasts:  Examined lying and sitting without masses, retractions, discharge or axillary adenopathy. Pelvic:  Ext/BUS/vagina virginal admitting small finger only  Cervix difficult to visualize but no abnormalities. Pap smear done  Uterus axial to anteverted, normal size, shape and contour, midline and mobile nontender   Adnexa  Without masses or tenderness    Anus and perineum  Normal   Rectovaginal  Normal sphincter tone without palpated masses or tenderness.    Assessment/Plan:  44 y.o. G0P0 female for annual exam.   1. Premature menopause.patient underwent menopause several years ago per Dr. Verl DickerGottsegen's workup. Had discussed options for HRT but she has always declined and is not interesting. Not having significant hot flashes, night sweats, vaginal dryness. No vaginal bleeding. Continue to monitor and report any vaginal bleeding. 2. Pap smear/HPV 2015 negative. No endocervical cells. Pap smear done today. No history of abnormal Pap smears previously. 3. Mammography  due now and I reminded her to schedule when she agrees to do so.  SBE monthly reviewed 4. Health maintenance. No routine lab work done as patient reports this done through work health screening. Follow up in one year, sooner as needed   [name],Kari Araki P MD, 4:33 PM 07/15/2015

## 2015-07-15 NOTE — Addendum Note (Signed)
Addended by: Dayna BarkerGARDNER, Alfredo Collymore K on: 07/15/2015 04:51 PM   Modules accepted: Orders

## 2015-07-16 LAB — URINALYSIS W MICROSCOPIC + REFLEX CULTURE
Bacteria, UA: NONE SEEN [HPF]
Bilirubin Urine: NEGATIVE
CASTS: NONE SEEN [LPF]
Crystals: NONE SEEN [HPF]
Glucose, UA: NEGATIVE
Hgb urine dipstick: NEGATIVE
KETONES UR: NEGATIVE
NITRITE: NEGATIVE
PH: 7 (ref 5.0–8.0)
Protein, ur: NEGATIVE
RBC / HPF: NONE SEEN RBC/HPF (ref <=2)
SPECIFIC GRAVITY, URINE: 1.03 (ref 1.001–1.035)
Squamous Epithelial / LPF: NONE SEEN [HPF] (ref <=5)
Yeast: NONE SEEN [HPF]

## 2015-07-17 LAB — URINE CULTURE
COLONY COUNT: NO GROWTH
Organism ID, Bacteria: NO GROWTH

## 2015-07-19 LAB — CYTOLOGY - PAP

## 2015-08-05 ENCOUNTER — Ambulatory Visit
Admission: RE | Admit: 2015-08-05 | Discharge: 2015-08-05 | Disposition: A | Discharge status: Home / Self Care | Payer: 59 | Source: Ambulatory Visit

## 2015-08-05 DIAGNOSIS — Z1231 Encounter for screening mammogram for malignant neoplasm of breast: Secondary | ICD-10-CM

## 2016-07-19 ENCOUNTER — Other Ambulatory Visit: Payer: Self-pay | Admitting: Gynecology

## 2016-07-19 DIAGNOSIS — Z1231 Encounter for screening mammogram for malignant neoplasm of breast: Secondary | ICD-10-CM

## 2016-08-20 ENCOUNTER — Ambulatory Visit
Admission: RE | Admit: 2016-08-20 | Discharge: 2016-08-20 | Disposition: A | Discharge status: Home / Self Care | Payer: 59 | Source: Ambulatory Visit | Attending: Gynecology | Admitting: Gynecology

## 2016-08-20 DIAGNOSIS — Z1231 Encounter for screening mammogram for malignant neoplasm of breast: Secondary | ICD-10-CM

## 2016-09-26 ENCOUNTER — Encounter: Payer: Self-pay | Admitting: Gynecology

## 2016-09-26 ENCOUNTER — Ambulatory Visit (INDEPENDENT_AMBULATORY_CARE_PROVIDER_SITE_OTHER): Payer: 59 | Admitting: Gynecology

## 2016-09-26 VITALS — BP 110/70 | Ht 66.5 in | Wt 140.0 lb

## 2016-09-26 DIAGNOSIS — Z01419 Encounter for gynecological examination (general) (routine) without abnormal findings: Secondary | ICD-10-CM

## 2016-09-26 NOTE — Patient Instructions (Signed)

## 2016-09-26 NOTE — Progress Notes (Signed)
    Kari HeronRene M Powell 08/27/1971 [id number]        46 y.o.  G0P0 for annual exam.    Past medical history,surgical history, problem list, medications, allergies, family history and social history were all reviewed and documented as reviewed in the EPIC chart.  ROS:  Performed with pertinent positives and negatives included in the history, assessment and plan.   Additional significant findings :  None   Designer, multimediaBlanca  assistant Vitals:   09/26/16 0907  BP: 110/70  Weight: 140 lb (63.5 kg)  Height: 5' 6.5" (1.689 m)   Body mass index is 22.26 kg/m.  General appearance:  Normal affect, orientation and appearance. Skin: Grossly normal HEENT: Without gross lesions.  No cervical or supraclavicular adenopathy. Thyroid normal.  Lungs:  Clear without wheezing, rales or rhonchi Cardiac: RR, without RMG Abdominal:  Soft, nontender, without masses, guarding, rebound, organomegaly or hernia Breasts:  Examined lying and sitting without masses, retractions, discharge or axillary adenopathy. Pelvic:  Ext, BUS, Vagina  virginal, normal to exam  Cervix normal  Uterus anteverted, normal size, shape and contour, midline and mobile nontender   Adnexa without masses or tenderness    Anus and perineum normal   Rectovaginal normal sphincter tone without palpated masses or tenderness.    Assessment/Plan:  46 y.o. G0P0 female for annual exam without menses, abstinent birth control.  1. Premature menopause. Doing well without significant hot flushes, night sweats, vaginal dryness or any vaginal bleeding. Has been offered and reviewed HRT in the past and has always declined. Not interested in initiating. 2. Pap smear 2016. Pap smear/HPV 2015 negative. No Pap smear done today. No history of abnormal Pap smears previously. Plan repeat Pap smear at 5 year interval from her 2015 per current screening guidelines.  3. Mammography 08/2016. Continue with annual mammography when due. SBE monthly reviewed. 4. DEXA 2014  normal. Plan repeat DEXA at age 46 due to premature menopause. Increased calcium vitamin D. 5. Health maintenance. No routine lab work done as patient reports this done elsewhere.   [name],[name] P MD, 9:22 AM 09/26/2016

## 2017-08-21 ENCOUNTER — Ambulatory Visit
Admission: RE | Admit: 2017-08-21 | Discharge: 2017-08-21 | Disposition: A | Discharge status: Home / Self Care | Payer: 59 | Source: Ambulatory Visit | Attending: Gynecology | Admitting: Gynecology

## 2017-08-21 ENCOUNTER — Telehealth: Payer: Self-pay | Admitting: *Deleted

## 2017-08-21 ENCOUNTER — Other Ambulatory Visit: Payer: Self-pay | Admitting: Gynecology

## 2017-08-21 DIAGNOSIS — Z1231 Encounter for screening mammogram for malignant neoplasm of breast: Secondary | ICD-10-CM

## 2017-08-21 NOTE — Telephone Encounter (Signed)
Patient called and left message in voicemail requesting name of foot doctor regarding plantar fascitis. I called pt and received her voicemail and told her [facility] to call and schedule.

## 2017-09-02 ENCOUNTER — Ambulatory Visit: Payer: 59 | Admitting: Podiatry

## 2017-09-02 ENCOUNTER — Encounter: Payer: Self-pay | Admitting: Podiatry

## 2017-09-02 ENCOUNTER — Ambulatory Visit (INDEPENDENT_AMBULATORY_CARE_PROVIDER_SITE_OTHER): Payer: 59

## 2017-09-02 VITALS — BP 128/84

## 2017-09-02 DIAGNOSIS — M722 Plantar fascial fibromatosis: Secondary | ICD-10-CM | POA: Diagnosis not present

## 2017-09-02 MED ORDER — MELOXICAM 15 MG PO TABS
15.0000 mg | ORAL_TABLET | Freq: Every day | ORAL | 1 refills | Status: AC
Start: 2017-09-02 — End: 2017-10-02

## 2017-09-07 NOTE — Progress Notes (Signed)
   Subjective: 47 year old female is a new patient presents today for pain and tenderness in the right plantar heel that began over 3 months ago.  She states she has been taking naproxen with minimal relief. Patient states that it hurts in the mornings with the first steps out of bed. Patient presents today for further treatment and evaluation.   Past Medical History:  Diagnosis Date  . Premature ovarian failure      Objective: Physical Exam General: The patient is alert and oriented x3 in no acute distress.  Dermatology: Skin is warm, dry and supple bilateral lower extremities. Negative for open lesions or macerations bilateral.   Vascular: Dorsalis Pedis and Posterior Tibial pulses palpable bilateral.  Capillary fill time is immediate to all digits.  Neurological: Epicritic and protective threshold intact bilateral.   Musculoskeletal: Tenderness to palpation at the medial calcaneal tubercale and through the insertion of the plantar fascia of the right foot. All other joints range of motion within normal limits bilateral. Strength 5/5 in all groups bilateral.   Radiographic exam: Normal osseous mineralization. Joint spaces preserved. No fracture/dislocation/boney destruction. Calcaneal spur present with mild thickening of plantar fascia right. No other soft tissue abnormalities or radiopaque foreign bodies.   Assessment: 1. Plantar fasciitis right 2. Pain in right foot  Plan of Care:  1.  Patient evaluated. Xrays reviewed.   2.  Injection of 0.5cc Celestone soluspan injected into the right plantar fascia  3.  Prescription for meloxicam provided to patient. 4.  Plantar fascial brace dispensed. 5.  Return to clinic in 4 weeks.    Has a small gym at work.   Felecia ShellingBrent M. Alitza Cowman, DPM [facility]  Dr. Felecia ShellingBrent M. Momen Ham, DPM    [address]                                        [place], [place] [address]                Office [phone]  Fax [phone]  [phone]

## 2017-09-27 ENCOUNTER — Encounter: Payer: 59 | Admitting: Gynecology

## 2017-10-02 ENCOUNTER — Ambulatory Visit (INDEPENDENT_AMBULATORY_CARE_PROVIDER_SITE_OTHER): Payer: Managed Care, Other (non HMO) | Admitting: Podiatry

## 2017-10-02 ENCOUNTER — Encounter: Payer: Self-pay | Admitting: Podiatry

## 2017-10-02 DIAGNOSIS — M722 Plantar fascial fibromatosis: Secondary | ICD-10-CM | POA: Diagnosis not present

## 2017-10-03 ENCOUNTER — Ambulatory Visit (INDEPENDENT_AMBULATORY_CARE_PROVIDER_SITE_OTHER): Payer: Managed Care, Other (non HMO) | Admitting: Gynecology

## 2017-10-03 ENCOUNTER — Encounter: Payer: Self-pay | Admitting: Gynecology

## 2017-10-03 VITALS — BP 110/66 | Ht 66.0 in | Wt 126.8 lb

## 2017-10-03 DIAGNOSIS — Z01419 Encounter for gynecological examination (general) (routine) without abnormal findings: Secondary | ICD-10-CM

## 2017-10-03 NOTE — Patient Instructions (Signed)
Follow-up in 1 year for annual exam, sooner if any issues. 

## 2017-10-03 NOTE — Progress Notes (Signed)
    Kari HeronRene M Powell [birth year] [id number]        47 y.o.  G0P0 for annual gynecologic exam.  Without gynecologic complaints  Past medical history,surgical history, problem list, medications, allergies, family history and social history were all reviewed and documented as reviewed in the EPIC chart.  ROS:  Performed with pertinent positives and negatives included in the history, assessment and plan.   Additional significant findings : Normal   Exam: Kari PortelaKim Powell assistant Vitals:   10/03/17 0907  BP: 110/66  Weight: 126 lb 12.8 oz (57.5 kg)  Height: 5\' 6"  (1.676 m)   Body mass index is 20.47 kg/m.  General appearance:  Normal affect, orientation and appearance. Skin: Grossly normal HEENT: Without gross lesions.  No cervical or supraclavicular adenopathy. Thyroid normal.  Lungs:  Clear without wheezing, rales or rhonchi Cardiac: RR, without RMG Abdominal:  Soft, nontender, without masses, guarding, rebound, organomegaly or hernia Breasts:  Examined lying and sitting without masses, retractions, discharge or axillary adenopathy. Pelvic:  Ext, BUS, Vagina: Normal noting virginal status  Cervix: Normal  Uterus: Anteverted, normal size, shape and contour, midline and mobile nontender   Adnexa: Without masses or tenderness    Anus and perineum: Normal   Rectovaginal: Normal sphincter tone without palpated masses or tenderness.    Assessment/Plan:  47 y.o. G0P0 female for annual gynecologic exam.   1. Postmenopausal.  Underwent premature menopause.  No significant symptoms such as hot flashes or night sweats.  No vaginal bleeding.  Continue to monitor and report any issues or bleeding.  We have discussed HRT in the past and she has declined. 2. DEXA 2014 normal done due to premature menopause.  Plan repeat DEXA at age 47. 3. Mammography 08/2017.  Continue with annual mammography when due.  Breast exam normal today. 4. Pap smear 07/2015.  Pap smear/HPV 2015.  No Pap smear done today.   No history of abnormal Pap smears previously.  Plan repeat Pap smear at 5-year interval from her HPV Pap smear. 5. Health maintenance.  Patient reports routine lab work done elsewhere.  Follow-up in 1 year, sooner as needed.   Kari Lordsimothy P Jimmye Wisnieski MD, 9:29 AM 10/03/2017

## 2017-10-06 NOTE — Progress Notes (Signed)
   Subjective: 47 year old female presenting today for follow up evaluation of right plantar fasciitis. She reports continued pain although it has improved. She states wearing the fascial brace, taking Meloxicam and receiving the injection at the previous visit have all helped to alleviate the pain. Patient presents today for further treatment and evaluation.   Past Medical History:  Diagnosis Date  . Plantar fasciitis Right heel  . Premature ovarian failure      Objective: Physical Exam General: The patient is alert and oriented x3 in no acute distress.  Dermatology: Skin is warm, dry and supple bilateral lower extremities. Negative for open lesions or macerations bilateral.   Vascular: Dorsalis Pedis and Posterior Tibial pulses palpable bilateral.  Capillary fill time is immediate to all digits.  Neurological: Epicritic and protective threshold intact bilateral.   Musculoskeletal: Tenderness to palpation at the medial calcaneal tubercale and through the insertion of the plantar fascia of the right foot. All other joints range of motion within normal limits bilateral. Strength 5/5 in all groups bilateral.    Assessment: 1. Plantar fasciitis right - improved 2. Pain in right foot - improved  Plan of Care:  1.  Patient evaluated.  2.  Injection of 0.5cc Celestone soluspan injected into the right plantar fascia  3.  Continue wearing plantar fascial brace, taking Meloxicam, wearing good shoe gear and doing stretching exercises.  4.  Return to clinic as needed.   Has a small gym at work.   Felecia ShellingBrent M. Rodnesha Elie, DPM [facility]  Dr. Felecia ShellingBrent M. Trixy Loyola, DPM    [address]                                        , [place] [address]                Office [phone]  Fax [phone]

## 2017-12-31 ENCOUNTER — Telehealth: Payer: Self-pay | Admitting: Podiatry

## 2017-12-31 NOTE — Telephone Encounter (Signed)
This is Kari Powell and I've seen Dr. Logan Bores. I don't need to schedule an appointment, I just need some medical advice. I just want to know what to do for the plantar fasciitis of my right foot and now I have a bone spur. I was seen in December and January for an injection and medication. The pain hasn't gone away but its not unbearable. I tried doing exercises to stretch the plantar fasciitis. Is there something else I should be doing? I'm still wearing sneakers as its not sandal weather yet. I'm mostly staying off the treadmill but I've been using general walking exercises outside, leisurely walking. I think the injection has worn off and I'm no longer taking the medicine. I don't want to rely on medication the rest of my life or have an injection every month or two. Just wanted to know if this is ever going to go away and is this normal? Is there something I should be doing to relieve the plantar fasciitis? Right now I'm standing against a wall trying to stretch my foot and the plantar fasciitis. I'm also using a belt where I sit on my bed and pull my foot forward to stretch my heel. On occasion I'm rolling a tennis ball on my heel and sometimes I use a frozen water bottle instead, but its still making it difficult to walk. Is this pain normal and would it continue to come back? Is there any better stretching exercises I should be doing? You can give me a call back at [phone] and leave a message.

## 2017-12-31 NOTE — Telephone Encounter (Signed)
Left message informing pt that although plantar fasciitis could be chronic but did not mean she had to spend a lifetime on antiinflammatories or getting injections, that she should come in to discuss orthotics with Dr. Logan Bores, that would support the structures of the feet, that she should look at a track for cardio, treadmills did not allow people to use their regular gait and to avoid hills, and to continue the stretches, especially before an after exercise.

## 2018-11-10 ENCOUNTER — Other Ambulatory Visit: Payer: Self-pay | Admitting: Gynecology

## 2018-11-10 DIAGNOSIS — Z1231 Encounter for screening mammogram for malignant neoplasm of breast: Secondary | ICD-10-CM

## 2018-12-22 ENCOUNTER — Other Ambulatory Visit: Payer: Self-pay

## 2018-12-24 ENCOUNTER — Encounter: Payer: Self-pay | Admitting: Gynecology

## 2018-12-24 ENCOUNTER — Other Ambulatory Visit: Payer: Self-pay

## 2018-12-24 ENCOUNTER — Ambulatory Visit (INDEPENDENT_AMBULATORY_CARE_PROVIDER_SITE_OTHER): Payer: Managed Care, Other (non HMO) | Admitting: Gynecology

## 2018-12-24 VITALS — BP 118/74 | Ht 66.0 in | Wt 130.0 lb

## 2018-12-24 DIAGNOSIS — Z01419 Encounter for gynecological examination (general) (routine) without abnormal findings: Secondary | ICD-10-CM | POA: Diagnosis not present

## 2018-12-24 NOTE — Progress Notes (Signed)
    Kari Powell 21-May-1971 [id number]        48 y.o.  G0P0 for annual gynecologic exam.  Doing well without gynecologic complaints  Past medical history,surgical history, problem list, medications, allergies, family history and social history were all reviewed and documented as reviewed in the EPIC chart.  ROS:  Performed with pertinent positives and negatives included in the history, assessment and plan.   Additional significant findings : None   Exam: Kari Powell assistant Vitals:   12/24/18 0802  BP: 118/74  Weight: 130 lb (59 kg)  Height: 5\' 6"  (1.676 m)   Body mass index is 20.98 kg/m.  General appearance:  Normal affect, orientation and appearance. Skin: Grossly normal HEENT: Without gross lesions.  No cervical or supraclavicular adenopathy. Thyroid normal.  Lungs:  Clear without wheezing, rales or rhonchi Cardiac: RR, without RMG Abdominal:  Soft, nontender, without masses, guarding, rebound, organomegaly or hernia Breasts:  Examined lying and sitting without masses, retractions, discharge or axillary adenopathy. Pelvic:  Ext, BUS, Vagina: Virginal  Cervix: Normal  Uterus: Anteverted, normal size, shape and contour, midline and mobile nontender   Adnexa: Without masses or tenderness    Anus and perineum: Normal   Rectovaginal: Normal sphincter tone without palpated masses or tenderness.    Assessment/Plan:  48 y.o. G0P0 female for annual gynecologic exam.   1. Premature menopause.  No significant menopausal symptoms.  Had discussed HRT in the past and she declined. 2. Pap smear 2016.  Pap smear done today.  No history of abnormal Pap smears.  Negative HPV screen in the past 3. Mammography 2018.  Patient knows she needs to schedule a mammogram and will do so after the COVID restrictions lifted.  Breast exam normal today. 4. DEXA 2014 normal.  Plan repeat DEXA next year at age 48 due to history of premature menopause. 5. Health maintenance.  No routine lab work done  as patient does this elsewhere.  Follow-up 1 year, sooner as needed.   Kari Lords MD, 8:31 AM 12/24/2018

## 2018-12-24 NOTE — Patient Instructions (Signed)
Follow-up in 1 year for annual exam 

## 2018-12-24 NOTE — Addendum Note (Signed)
Addended by: Dayna Barker on: 12/24/2018 08:58 AM   Modules accepted: Orders

## 2018-12-25 LAB — PAP IG W/ RFLX HPV ASCU: PAP Smear Comment: 0

## 2019-01-13 ENCOUNTER — Other Ambulatory Visit: Payer: Self-pay

## 2019-01-13 ENCOUNTER — Ambulatory Visit
Admission: RE | Admit: 2019-01-13 | Discharge: 2019-01-13 | Disposition: A | Discharge status: Home / Self Care | Payer: Managed Care, Other (non HMO) | Source: Ambulatory Visit | Attending: Gynecology | Admitting: Gynecology

## 2019-01-13 DIAGNOSIS — Z1231 Encounter for screening mammogram for malignant neoplasm of breast: Secondary | ICD-10-CM

## 2019-05-27 ENCOUNTER — Encounter: Payer: Self-pay | Admitting: Gynecology

## 2019-09-03 DIAGNOSIS — Z0289 Encounter for other administrative examinations: Secondary | ICD-10-CM

## 2019-12-10 ENCOUNTER — Other Ambulatory Visit: Payer: Self-pay | Admitting: Obstetrics and Gynecology

## 2019-12-10 DIAGNOSIS — Z1231 Encounter for screening mammogram for malignant neoplasm of breast: Secondary | ICD-10-CM

## 2020-01-01 ENCOUNTER — Encounter: Payer: Managed Care, Other (non HMO) | Admitting: Obstetrics and Gynecology

## 2020-01-06 ENCOUNTER — Other Ambulatory Visit: Payer: Self-pay

## 2020-01-07 ENCOUNTER — Encounter: Payer: Self-pay | Admitting: Internal Medicine

## 2020-01-07 ENCOUNTER — Encounter: Payer: Self-pay | Admitting: Obstetrics and Gynecology

## 2020-01-07 ENCOUNTER — Ambulatory Visit (INDEPENDENT_AMBULATORY_CARE_PROVIDER_SITE_OTHER): Payer: Managed Care, Other (non HMO) | Admitting: Obstetrics and Gynecology

## 2020-01-07 ENCOUNTER — Telehealth: Payer: Self-pay | Admitting: *Deleted

## 2020-01-07 VITALS — BP 116/74 | Ht 66.0 in | Wt 131.0 lb

## 2020-01-07 DIAGNOSIS — Z780 Asymptomatic menopausal state: Secondary | ICD-10-CM

## 2020-01-07 DIAGNOSIS — Z01419 Encounter for gynecological examination (general) (routine) without abnormal findings: Secondary | ICD-10-CM | POA: Diagnosis not present

## 2020-01-07 DIAGNOSIS — Z13220 Encounter for screening for lipoid disorders: Secondary | ICD-10-CM | POA: Diagnosis not present

## 2020-01-07 DIAGNOSIS — Z13820 Encounter for screening for osteoporosis: Secondary | ICD-10-CM | POA: Diagnosis not present

## 2020-01-07 NOTE — Telephone Encounter (Signed)
I called patient and left detailed message on cell per DPR access. No referrals needed for [facility] or [facility] may call both to schedule.  I also relayed dexa order placed at this office and she can call appointments to schedule this when she is ready.

## 2020-01-07 NOTE — Telephone Encounter (Signed)
-----   Message from Keenan Bachelor, [place] sent at 01/07/2020  9:38 AM EDT ----- Regarding: FW: DEXA and PCP referral?  ----- Message ----- From: Theresia Majors, MD Sent: 01/07/2020   9:14 AM EDT To: [facility] Subject: DEXA and PCP referral?                         This patient was looking for a recommendation for primary care doctor and said something about needing a referral. She is also due for a repeat DEXA as she has premature menopause if we could please have her schedule this (ordered). I told her to just contact [facility] to set up a routine screening colonoscopy but she seemed to have questions about this too. Thank you.

## 2020-01-07 NOTE — Progress Notes (Addendum)
   Amparo Donalson Buster November 09, 1970 [id number]  SUBJECTIVE:  49 y.o. G0P0 female for annual routine gynecologic exam. She has no gynecologic concerns.  Current Outpatient Medications  Medication Sig Dispense Refill  . Ascorbic Acid (VITAMIN C PO) Take by mouth.    . Calcium Carbonate-Vitamin D (CALCIUM + D PO) Take by mouth.    . Cyanocobalamin (VITAMIN B-12 CR PO) Take by mouth.    Marland Kitchen FLUZONE QUADRIVALENT 0.5 ML injection ADM 0.5ML IM UTD  0  . Multiple Vitamin (MULTIVITAMIN) tablet Take 1 tablet by mouth daily.     No current facility-administered medications for this visit.   Allergies: Bee venom  Patient's last menstrual period was 10/30/2010.  Past medical history,surgical history, problem list, medications, allergies, family history and social history were all reviewed and documented as reviewed in the EPIC chart.  ROS:  Feeling well. No dyspnea or chest pain on exertion.  No abdominal pain, change in bowel habits, black or bloody stools.  No urinary tract symptoms. GYN ROS: no abnormal bleeding, pelvic pain or discharge, no breast pain or new or enlarging lumps on self exam. No neurological complaints.    OBJECTIVE:  BP 116/74   Ht 5\' 6"  (1.676 m)   Wt 131 lb (59.4 kg)   LMP 10/30/2010   BMI 21.14 kg/m  The patient appears well, alert, oriented x 3, in no distress. ENT normal.  Neck supple. No cervical or supraclavicular adenopathy or thyromegaly.  Lungs are clear, good air entry, no wheezes, rhonchi or rales. S1 and S2 normal, no murmurs, regular rate and rhythm.  Abdomen soft without tenderness, guarding, mass or organomegaly.  Neurological is normal, no focal findings.  BREAST EXAM: breasts appear normal, no suspicious masses, no skin or nipple changes or axillary nodes  PELVIC EXAM: VULVA: normal appearing vulva with no masses, tenderness or lesions, VAGINA: normal appearing vagina with normal color and discharge, no lesions, CERVIX: Limited exam due to patient  intolerance of speculum exam (pediatric speculum used and opened just a crack which is all she would tolerate), cervix palpates normal, UTERUS and ADNEXA: Limited exam due to patient intolerance of exam but no palpable masses or uterine enlargement Chaperone: 11/01/2010 present during the examination  ASSESSMENT:  49 y.o. G0P0 here for annual gynecologic exam  PLAN:   1. Premature menopause.  No symptoms of hot flashes or night sweats.  No vaginal bleeding.  She will let 44 know if any problems or symptoms occur.  I did also recommend that she follow through with previous recommendation to seek a primary doctor for any other health issues that arise outside of gynecologic concerns.  She requests that we have staff contact her to help her find a primary care doctor. 2. Pap smear 2020 (HPV test was not done).  No significant history of abnormal Pap smears.  Next Pap smear due 2023 following the current guidelines recommending the 3 year interval. 3. Mammogram 01/2019.  Normal breast exam today.  Next mammogram scheduled for 02/15/2020.   4. Colonoscopy is recommended at age 66, contacts are provided so she can get this scheduled.  5. DEXA 2014 normal.  Next DEXA was recommended this year as she was prematurely menopausal, so she plans to schedule this soon. 6. Health maintenance.  She will proceed to lab today for routine screening blood work (lipids, CBC, CMP, TSH, vitamin D level). Labs requested to go to [facility].  Return annually or sooner, prn.  44 MD 01/07/20

## 2020-01-08 LAB — CBC
Hematocrit: 40.6 % (ref 34.0–46.6)
Hemoglobin: 13.1 g/dL (ref 11.1–15.9)
MCH: 30 pg (ref 26.6–33.0)
MCHC: 32.3 g/dL (ref 31.5–35.7)
MCV: 93 fL (ref 79–97)
Platelets: 269 10*3/uL (ref 150–450)
RBC: 4.36 x10E6/uL (ref 3.77–5.28)
RDW: 12.2 % (ref 11.7–15.4)
WBC: 5 10*3/uL (ref 3.4–10.8)

## 2020-01-08 LAB — COMPREHENSIVE METABOLIC PANEL
ALT: 16 IU/L (ref 0–32)
AST: 17 IU/L (ref 0–40)
Albumin/Globulin Ratio: 1.7 (ref 1.2–2.2)
Albumin: 4.2 g/dL (ref 3.8–4.8)
Alkaline Phosphatase: 65 IU/L (ref 39–117)
BUN/Creatinine Ratio: 13 (ref 9–23)
BUN: 13 mg/dL (ref 6–24)
Bilirubin Total: <0.2 mg/dL (ref 0.0–1.2)
CO2: 26 mmol/L (ref 20–29)
Calcium: 9.5 mg/dL (ref 8.7–10.2)
Chloride: 105 mmol/L (ref 96–106)
Creatinine, Ser: 0.98 mg/dL (ref 0.57–1.00)
GFR calc Af Amer: 78 mL/min/{1.73_m2} (ref >59)
GFR calc non Af Amer: 67 mL/min/{1.73_m2} (ref >59)
Globulin, Total: 2.5 g/dL (ref 1.5–4.5)
Glucose: 82 mg/dL (ref 65–99)
Potassium: 4.1 mmol/L (ref 3.5–5.2)
Sodium: 144 mmol/L (ref 134–144)
Total Protein: 6.7 g/dL (ref 6.0–8.5)

## 2020-01-08 LAB — TSH: TSH: 1.51 u[IU]/mL (ref 0.450–4.500)

## 2020-01-08 LAB — LIPID PANEL
Chol/HDL Ratio: 3.6 ratio (ref 0.0–4.4)
Cholesterol, Total: 205 mg/dL — ABNORMAL HIGH (ref 100–199)
HDL: 57 mg/dL (ref >39)
LDL Chol Calc (NIH): 133 mg/dL — ABNORMAL HIGH (ref 0–99)
Triglycerides: 83 mg/dL (ref 0–149)
VLDL Cholesterol Cal: 15 mg/dL (ref 5–40)

## 2020-01-08 LAB — VITAMIN D 25 HYDROXY (VIT D DEFICIENCY, FRACTURES): Vit D, 25-Hydroxy: 60.1 ng/mL (ref 30.0–100.0)

## 2020-01-25 ENCOUNTER — Other Ambulatory Visit: Payer: Self-pay

## 2020-01-26 ENCOUNTER — Other Ambulatory Visit: Payer: Self-pay | Admitting: Obstetrics & Gynecology

## 2020-01-26 ENCOUNTER — Other Ambulatory Visit: Payer: Self-pay | Admitting: Obstetrics and Gynecology

## 2020-01-26 ENCOUNTER — Ambulatory Visit (INDEPENDENT_AMBULATORY_CARE_PROVIDER_SITE_OTHER): Payer: Managed Care, Other (non HMO)

## 2020-01-26 DIAGNOSIS — Z01419 Encounter for gynecological examination (general) (routine) without abnormal findings: Secondary | ICD-10-CM

## 2020-01-26 DIAGNOSIS — Z780 Asymptomatic menopausal state: Secondary | ICD-10-CM | POA: Diagnosis not present

## 2020-01-26 DIAGNOSIS — Z13820 Encounter for screening for osteoporosis: Secondary | ICD-10-CM

## 2020-02-15 ENCOUNTER — Other Ambulatory Visit: Payer: Self-pay

## 2020-02-15 ENCOUNTER — Ambulatory Visit
Admission: RE | Admit: 2020-02-15 | Discharge: 2020-02-15 | Disposition: A | Discharge status: Home / Self Care | Payer: Managed Care, Other (non HMO) | Source: Ambulatory Visit | Attending: Obstetrics and Gynecology | Admitting: Obstetrics and Gynecology

## 2020-02-15 DIAGNOSIS — Z1231 Encounter for screening mammogram for malignant neoplasm of breast: Secondary | ICD-10-CM

## 2020-02-16 ENCOUNTER — Ambulatory Visit (AMBULATORY_SURGERY_CENTER): Payer: Self-pay | Admitting: *Deleted

## 2020-02-16 ENCOUNTER — Other Ambulatory Visit: Payer: Self-pay

## 2020-02-16 VITALS — Ht 66.0 in | Wt 132.0 lb

## 2020-02-16 DIAGNOSIS — Z1211 Encounter for screening for malignant neoplasm of colon: Secondary | ICD-10-CM

## 2020-02-16 NOTE — Progress Notes (Signed)

## 2020-02-22 ENCOUNTER — Encounter: Payer: Self-pay | Admitting: Internal Medicine

## 2020-03-01 ENCOUNTER — Other Ambulatory Visit: Payer: Self-pay

## 2020-03-01 ENCOUNTER — Encounter: Payer: Self-pay | Admitting: Internal Medicine

## 2020-03-01 ENCOUNTER — Ambulatory Visit (AMBULATORY_SURGERY_CENTER): Payer: Managed Care, Other (non HMO) | Admitting: Internal Medicine

## 2020-03-01 VITALS — BP 110/74 | HR 60 | Temp 97.5°F | Resp 14 | Ht 66.0 in | Wt 132.0 lb

## 2020-03-01 DIAGNOSIS — Z1211 Encounter for screening for malignant neoplasm of colon: Secondary | ICD-10-CM | POA: Diagnosis not present

## 2020-03-01 DIAGNOSIS — K639 Disease of intestine, unspecified: Secondary | ICD-10-CM | POA: Diagnosis not present

## 2020-03-01 MED ORDER — SODIUM CHLORIDE 0.9 % IV SOLN: 500.0000 mL | INTRAVENOUS | Status: DC

## 2020-03-01 NOTE — Progress Notes (Signed)
Called to room to assist during endoscopic procedure.  Patient ID and intended procedure confirmed with present staff. Received instructions for my participation in the procedure from the performing physician.  

## 2020-03-01 NOTE — Progress Notes (Signed)
To PACU, VSS. Report to [email or web address] 

## 2020-03-01 NOTE — Patient Instructions (Signed)
Handout given for diverticulosis.  YOU HAD AN ENDOSCOPIC PROCEDURE TODAY AT THE [facility]:   Refer to the procedure report that was given to you for any specific questions about what was found during the examination.  If the procedure report does not answer your questions, please call your gastroenterologist to clarify.  If you requested that your care partner not be given the details of your procedure findings, then the procedure report has been included in a sealed envelope for you to review at your convenience later.  YOU SHOULD EXPECT: Some feelings of bloating in the abdomen. Passage of more gas than usual.  Walking can help get rid of the air that was put into your GI tract during the procedure and reduce the bloating. If you had a lower endoscopy (such as a colonoscopy or flexible sigmoidoscopy) you may notice spotting of blood in your stool or on the toilet paper. If you underwent a bowel prep for your procedure, you may not have a normal bowel movement for a few days.  Please Note:  You might notice some irritation and congestion in your nose or some drainage.  This is from the oxygen used during your procedure.  There is no need for concern and it should clear up in a day or so.  SYMPTOMS TO REPORT IMMEDIATELY:   Following lower endoscopy (colonoscopy or flexible sigmoidoscopy):  Excessive amounts of blood in the stool  Significant tenderness or worsening of abdominal pains  Swelling of the abdomen that is new, acute  Fever of 100F or higher  For urgent or emergent issues, a gastroenterologist can be reached at any hour by calling [phone]. Do not use MyChart messaging for urgent concerns.    DIET:  We do recommend a small meal at first, but then you may proceed to your regular diet.  Drink plenty of fluids but you should avoid alcoholic beverages for 24 hours.  ACTIVITY:  You should plan to take it easy for the rest of today and you should NOT DRIVE or use  heavy machinery until tomorrow (because of the sedation medicines used during the test).    FOLLOW UP: Our staff will call the number listed on your records 48-72 hours following your procedure to check on you and address any questions or concerns that you may have regarding the information given to you following your procedure. If we do not reach you, we will leave a message.  We will attempt to reach you two times.  During this call, we will ask if you have developed any symptoms of COVID 19. If you develop any symptoms (ie: fever, flu-like symptoms, shortness of breath, cough etc.) before then, please call [phone].  If you test positive for Covid 19 in the 2 weeks post procedure, please call and report this information to us.    If any biopsies were taken you will be contacted by phone or by letter within the next 1-3 weeks.  Please call us at [phone] if you have not heard about the biopsies in 3 weeks.    SIGNATURES/CONFIDENTIALITY: You and/or your care partner have signed paperwork which will be entered into your electronic medical record.  These signatures attest to the fact that that the information above on your After Visit Summary has been reviewed and is understood.  Full responsibility of the confidentiality of this discharge information lies with you and/or your care-partner. 

## 2020-03-01 NOTE — Op Note (Addendum)
[facility] Patient Name: Kari Powell Procedure Date: 03/01/2020 10:33 AM MRN: [id number] Endoscopist: Iva Boop , MD Age: 49 Referring MD:  Date of Birth: [birth year] Gender: Female Account #: [id number] Procedure:                Colonoscopy Indications:              Screening for colorectal malignant neoplasm, This                            is the patient's first colonoscopy Medicines:                Propofol per Anesthesia, Monitored Anesthesia Care Procedure:                Pre-Anesthesia Assessment:                           - Prior to the procedure, a History and Physical                            was performed, and patient medications and                            allergies were reviewed. The patient's tolerance of                            previous anesthesia was also reviewed. The risks                            and benefits of the procedure and the sedation                            options and risks were discussed with the patient.                            All questions were answered, and informed consent                            was obtained. Prior Anticoagulants: The patient has                            taken no previous anticoagulant or antiplatelet                            agents. ASA Grade Assessment: II - A patient with                            mild systemic disease. After reviewing the risks                            and benefits, the patient was deemed in                            satisfactory condition to undergo the procedure.  After obtaining informed consent, the colonoscope                            was passed under direct vision. Throughout the                            procedure, the patient's blood pressure, pulse, and                            oxygen saturations were monitored continuously. The                            Colonoscope was introduced through the anus and                             advanced to the the terminal ileum, with                            identification of the appendiceal orifice and IC                            valve. The colonoscopy was performed without                            difficulty. The patient tolerated the procedure                            well. The quality of the bowel preparation was                            excellent. The bowel preparation used was Miralax                            via split dose instruction. The terminal ileum,                            ileocecal valve, appendiceal orifice, and rectum                            were photographed. Scope In: 10:47:25 AM Scope Out: 11:04:19 AM Scope Withdrawal Time: 0 hours 13 minutes 25 seconds  Total Procedure Duration: 0 hours 16 minutes 54 seconds  Findings:                 The perianal and digital rectal examinations were                            normal.                           A localized area of mucosa in the terminal ileum                            was mildly erythematous and eroded. Biopsies were  taken with a cold forceps for histology.                            Verification of patient identification for the                            specimen was done. Estimated blood loss was minimal.                           A diffuse area of mildly erythematous and eroded                            mucosa was found in the ascending colon and in the                            cecum. Biopsies were taken with a cold forceps for                            histology. Verification of patient identification                            for the specimen was done. Estimated blood loss was                            minimal.                           Multiple diverticula were found in the sigmoid                            colon.                           The exam was otherwise without abnormality on                            direct and retroflexion  views. Complications:            No immediate complications. Estimated Blood Loss:     Estimated blood loss was minimal. Impression:               - Erythematous and eroded mucosa in the terminal                            ileum. single focus Biopsied.                           - Erythematous and eroded mucosa in the ascending                            colon and in the cecum. Biopsied. Overall suspect                            prep effect but want to rule out inflammatory bowel  disease                           - Diverticulosis in the sigmoid colon.                           - The examination was otherwise normal on direct                            and retroflexion views. Recommendation:           - Patient has a contact number available for                            emergencies. The signs and symptoms of potential                            delayed complications were discussed with the                            patient. Return to normal activities tomorrow.                            Written discharge instructions were provided to the                            patient.                           - Resume previous diet.                           - Continue present medications.                           - Await pathology results.                           - Repeat colonoscopy is recommended. The                            colonoscopy date will be determined after pathology                            results from today's exam become available for                            review.                           - cc: Dr. Enrique SackJeffrey Kendall Kip Cropp E Catheryn Slifer, MD 03/01/2020 11:14:25 AM This report has been signed electronically.

## 2020-03-01 NOTE — Progress Notes (Signed)
Vs CW I have reviewed the patient's medical history in detail and updated the computerized patient record.   

## 2020-03-03 ENCOUNTER — Telehealth: Payer: Self-pay

## 2020-03-03 NOTE — Telephone Encounter (Signed)
  Follow up Call-  Call back number 03/01/2020  Post procedure Call Back phone  # [phone]  Permission to leave phone message Yes  Some recent data might be hidden     Patient questions:  Do you have a fever, pain , or abdominal swelling? No. Pain Score  0 *  Have you tolerated food without any problems? Yes.    Have you been able to return to your normal activities? Yes.    Do you have any questions about your discharge instructions: Diet   No. Medications  No. Follow up visit  No.  Do you have questions or concerns about your Care? No.  Actions: * If pain score is 4 or above: No action needed, pain <4.  1. Have you developed a fever since your procedure? no  2.   Have you had an respiratory symptoms (SOB or cough) since your procedure? no  3.   Have you tested positive for COVID 19 since your procedure no  4.   Have you had any family members/close contacts diagnosed with the COVID 19 since your procedure?  no   If yes to any of these questions please route to Laverna Peace, RN and Charlett Lango, RN

## 2020-05-05 ENCOUNTER — Ambulatory Visit (INDEPENDENT_AMBULATORY_CARE_PROVIDER_SITE_OTHER): Payer: Managed Care, Other (non HMO) | Admitting: Internal Medicine

## 2020-05-05 ENCOUNTER — Encounter: Payer: Self-pay | Admitting: Internal Medicine

## 2020-05-05 VITALS — BP 104/70 | HR 76 | Ht 66.0 in | Wt 128.0 lb

## 2020-05-05 DIAGNOSIS — K639 Disease of intestine, unspecified: Secondary | ICD-10-CM

## 2020-05-05 NOTE — Patient Instructions (Signed)
Normal BMI (Body Mass Index- based on height and weight) is between 19 and 25. Your BMI today is Body mass index is 20.66 kg/m. Marland Kitchen Please consider follow up  regarding your BMI with your Primary Care Provider.   You may have crohn's , please call us with any questions and concerns. Call us if you have bowel changes or abdominal pain.  We are providing you with a handout to read about IBD.   I appreciate the opportunity to care for you. Stan Head, MD, [facility]

## 2020-05-05 NOTE — Progress Notes (Signed)
Kari Powell 49 y.o. October 24, 1970 [id number]  Assessment & Plan:   Mucosal abnormality of intestine question Crohn's of terminal ileum and right colon She feels well, and though the biopsies are somewhat suggestive of Crohn's disease it certainly not conclusive.  We talked about the pros and cons of treating an asymptomatic individual versus not and she prefers no medication at this time and I think that is very reasonable.  We will observe and plan for a follow-up in the office at 1 year.  Have decided not to pursue any serologies as would not change management at this time in my opinion.  She will return as needed or call back otherwise sooner.  I did give her a handout from [facility] with a link to that website where she could look at diet and inflammatory bowel disease specifically Crohn's disease as well as what Crohn's disease is and the signs and symptoms etc. and how it is treated.    I appreciate the opportunity to care for this patient. CC: Theresia Majors, MD   Subjective:   Chief Complaint: Abnormal colon and terminal ileal biopsies, inflammation here to discuss  HPI The patient is here for follow-up after a screening colonoscopy demonstrated inflammatory changes in the right colon and terminal ileum.  She had a localized area of terminal ileal mucosa that was mildly erythematous and eroded, and a diffuse area of mildly erythematous and eroded mucosa was seen in the ascending colon and cecum.  These were biopsied.  Again this was a screening colonoscopy she really was not having symptoms though she reports having added fiber and having improved constipation today.  Pathology results as below.  I have asked her to come in to review this.  Again some occasional loose stools constipation that is better with fiber.  She rarely eats meat if at all tends not to eat fish.  Sounds like mostly a vegetarian diet.  Family history notable for microscopic colitis in her mother.  No other  family history of inflammatory bowel disease.  Normal CBC in May. Diagnosis 1. Surgical [P], terminal ileum bx - MILDLY ACTIVE CHRONIC ILEITIS, SEE NOTE - NEGATIVE FOR GRANULOMAS OR DYSPLASIA 2. 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 Bee Venom Swelling    "BEE STINGS"   Current Meds  Medication Sig  . Ascorbic Acid (VITAMIN C PO) Take by mouth.  . Calcium Carb-Cholecalciferol (CALCIUM 1000 + D PO) Take by mouth.  . Calcium Carbonate-Vitamin D (CALCIUM + D PO) Take by mouth.  . Cyanocobalamin (VITAMIN B-12 CR PO) Take by mouth.  Marland Kitchen FLUZONE QUADRIVALENT 0.5 ML injection ADM 0.5ML IM UTD  . Multiple Vitamin (MULTIVITAMIN) tablet Take 1 tablet by mouth daily.  . psyllium (METAMUCIL) 58.6 % packet Take 1 packet by mouth daily.   Past Medical History:  Diagnosis Date  . Plantar fasciitis Right heel  . Premature ovarian failure    Past Surgical History:  Procedure Laterality Date  . COLONOSCOPY W/ BIOPSIES     Social History   Social History Narrative   Single, lives with her mother   Works at [employer] as a Holiday representative   No alcohol tobacco or drug use   family history includes Breast cancer (age of onset: 29) in her maternal grandmother; Colon polyps in her maternal aunt and maternal grandmother; Hypertension in her maternal grandfather; Osteoporosis  in her mother.   Review of Systems  As per HPI Objective:   Physical Exam BP 104/70   Pulse 76   Ht 5\' 6"  (1.676 m)   Wt 128 lb (58.1 kg)   LMP 10/30/2010   BMI 20.66 kg/m    Total time 25 minutes

## 2020-05-05 NOTE — Assessment & Plan Note (Addendum)
She feels well, and though the biopsies are somewhat suggestive of Crohn's disease it certainly not conclusive.  We talked about the pros and cons of treating an asymptomatic individual versus not and she prefers no medication at this time and I think that is very reasonable.  We will observe and plan for a follow-up in the office at 1 year.  Have decided not to pursue any serologies as would not change management at this time in my opinion.  She will return as needed or call back otherwise sooner.  I did give her a handout from [facility] with a link to that website where she could look at diet and inflammatory bowel disease specifically Crohn's disease as well as what Crohn's disease is and the signs and symptoms etc. and how it is treated.

## 2020-12-12 ENCOUNTER — Other Ambulatory Visit: Payer: Self-pay | Admitting: Obstetrics and Gynecology

## 2020-12-12 DIAGNOSIS — Z1231 Encounter for screening mammogram for malignant neoplasm of breast: Secondary | ICD-10-CM

## 2021-01-10 ENCOUNTER — Encounter: Payer: Managed Care, Other (non HMO) | Admitting: Obstetrics and Gynecology

## 2021-02-14 NOTE — Progress Notes (Signed)
50 y.o. G0P0 Single White or Caucasian Not Hispanic or Latino female here for annual exam.   She is PMP. Periods ended in her early 66's. No bleeding, no vasomotor symptoms.   She reports occasional vulvar itching. Not currently itchy, was a couple of days ago. No vaginal discharge.   She has had issues with constipation in the past, occasional diarrhea. Was told she had colitis in the past. She was told to increase her fiber and her bowels have normalized.   No bladder c/o.     Patient's last menstrual period was 10/30/2010.          Sexually active: No.  The current method of family planning is abstinence.    Exercising: Yes.    Home exercise routine includes walks. Smoker:  no  Health Maintenance: Pap:  12-24-18 normal, no hpv testing done.  History of abnormal Pap:  no MMG:  02-17-21 normal BMD:   01-26-20 normal Colonoscopy: 03-01-20 diverticula TDaP: ? unsure Gardasil: None   reports that she has never smoked. She has never used smokeless tobacco. She reports that she does not drink alcohol and does not use drugs. Lives with her Mother. Has a dog. Works at [employer], Probation officer.   Past Medical History:  Diagnosis Date   Plantar fasciitis Right heel   Premature ovarian failure     Past Surgical History:  Procedure Laterality Date   COLONOSCOPY W/ BIOPSIES      Current Outpatient Medications  Medication Sig Dispense Refill   Ascorbic Acid (VITAMIN C PO) Take by mouth.     Calcium Carb-Cholecalciferol (CALCIUM 1000 + D PO) Take by mouth.     Calcium Carbonate-Vitamin D (CALCIUM + D PO) Take by mouth.     Cyanocobalamin (VITAMIN B-12 CR PO) Take by mouth.     FLUZONE QUADRIVALENT 0.5 ML injection ADM 0.5ML IM UTD  0   Multiple Vitamin (MULTIVITAMIN) tablet Take 1 tablet by mouth daily.     psyllium (METAMUCIL) 58.6 % packet Take 1 packet by mouth daily.     No current facility-administered medications for this visit.    Family History  Problem  Relation Age of Onset   Breast cancer Maternal Grandmother 67   Colon polyps Maternal Grandmother    Hypertension Maternal Grandfather    Osteoporosis Mother    Colon polyps Maternal Aunt    Colon cancer Neg Hx    Esophageal cancer Neg Hx    Stomach cancer Neg Hx     Review of Systems  All other systems reviewed and are negative.  Exam:   BP 116/70 (BP Location: Right Arm, Patient Position: Sitting, Cuff Size: Normal)   Pulse 76   Ht 5' 5.5" (1.664 m)   Wt 130 lb (59 kg)   LMP 10/30/2010   SpO2 98%   BMI 21.30 kg/m   Weight change: @WEIGHTCHANGE @ Height:   Height: 5' 5.5" (166.4 cm)  Ht Readings from Last 3 Encounters:  02/21/21 5' 5.5" (1.664 m)  05/05/20 5\' 6"  (1.676 m)  03/01/20 5\' 6"  (1.676 m)    General appearance: alert, cooperative and appears stated age Head: Normocephalic, without obvious abnormality, atraumatic Neck: no adenopathy, supple, symmetrical, trachea midline and thyroid normal to inspection and palpation Lungs: clear to auscultation bilaterally Cardiovascular: regular rate and rhythm Breasts: normal appearance, no masses or tenderness Abdomen: soft, non-tender; non distended,  no masses,  no organomegaly Extremities: extremities normal, atraumatic, no cyanosis or edema Skin: Skin color, texture, turgor normal.  No rashes or lesions Lymph nodes: Cervical, supraclavicular, and axillary nodes normal. No abnormal inguinal nodes palpated Neurologic: Grossly normal   Pelvic: External genitalia:  on the lower outer left labia minora is a 1.5 area of skin changes, erythema/pigment change. Minimal agglutination between the left labia minora and majora.              Urethra:  normal appearing urethra with no masses, tenderness or lesions              Bartholins and Skenes: normal                 Vagina: the patient was very tender at the introitus with palpation with a cotton swab. No speculum exam was performed. Deferred digital vaginal exam as well.                 Rectal exam: BM performed no masses or tenderness.                Anus:  normal sphincter tone, no lesions  [name] chaperoned for the exam.  1. Well woman exam Discussed breast self exam Discussed calcium and vit D intake Needs pap with hpv next year Mammogram just done DEXA normal last year Colonoscopy UTD  2. Vulvar pruritus Intermittently, small area of skin changes.  - WET PREP FOR TRICH, YEAST, CLUE: negative - betamethasone valerate ointment (VALISONE) 0.1 %; Apply 1 application topically 2 (two) times daily.  Dispense: 30 g; Refill: 0 -F/u in 2 weeks  3. Vulvodynia Discussed vulvodynia. Only a problem for her when she comes here. No baseline pain. No desire for vaginal penetration. Given a sample of lidocaine jelly to apply prior to her next gyn visit (when internal exam is planned). Discussed that there are treatment options, but since it is only an issue one x a year I don't think it is worth long term treatment.   Over 40 minutes was spent in total patient care. In addition to the annual exam, vulvar pruritus and vulvodynia were evaluated and treatment was discussed and implemented.

## 2021-02-16 ENCOUNTER — Ambulatory Visit
Admission: RE | Admit: 2021-02-16 | Discharge: 2021-02-16 | Disposition: A | Discharge status: Home / Self Care | Payer: Managed Care, Other (non HMO) | Source: Ambulatory Visit | Attending: Obstetrics and Gynecology | Admitting: Obstetrics and Gynecology

## 2021-02-16 ENCOUNTER — Other Ambulatory Visit: Payer: Self-pay

## 2021-02-16 DIAGNOSIS — Z1231 Encounter for screening mammogram for malignant neoplasm of breast: Secondary | ICD-10-CM

## 2021-02-21 ENCOUNTER — Encounter: Payer: Self-pay | Admitting: Obstetrics and Gynecology

## 2021-02-21 ENCOUNTER — Ambulatory Visit (INDEPENDENT_AMBULATORY_CARE_PROVIDER_SITE_OTHER): Payer: Managed Care, Other (non HMO) | Admitting: Obstetrics and Gynecology

## 2021-02-21 ENCOUNTER — Other Ambulatory Visit: Payer: Self-pay

## 2021-02-21 VITALS — BP 116/70 | HR 76 | Ht 65.5 in | Wt 130.0 lb

## 2021-02-21 DIAGNOSIS — L292 Pruritus vulvae: Secondary | ICD-10-CM

## 2021-02-21 DIAGNOSIS — Z01419 Encounter for gynecological examination (general) (routine) without abnormal findings: Secondary | ICD-10-CM

## 2021-02-21 DIAGNOSIS — N94819 Vulvodynia, unspecified: Secondary | ICD-10-CM | POA: Diagnosis not present

## 2021-02-21 LAB — WET PREP FOR TRICH, YEAST, CLUE

## 2021-02-21 MED ORDER — BETAMETHASONE VALERATE 0.1 % EX OINT: 1.0000 "application " | TOPICAL_OINTMENT | Freq: Two times a day (BID) | CUTANEOUS | 0 refills | Status: DC

## 2021-02-21 NOTE — Patient Instructions (Addendum)
EXERCISE   We recommended that you start or continue a regular exercise program for good health. Physical activity is anything that gets your body moving, some is better than none. The CDC recommends 150 minutes per week of Moderate-Intensity Aerobic Activity and 2 or more days of Muscle Strengthening Activity.  Benefits of exercise are limitless: helps weight loss/weight maintenance, improves mood and energy, helps with depression and anxiety, improves sleep, tones and strengthens muscles, improves balance, improves bone density, protects from chronic conditions such as heart disease, high blood pressure and diabetes and so much more. To learn more visit: https://www.cdc.gov/physicalactivity/index.html  DIET: Good nutrition starts with a healthy diet of fruits, vegetables, whole grains, and lean protein sources. Drink plenty of water for hydration. Minimize empty calories, sodium, sweets. For more information about dietary recommendations visit: https://health.gov/our-work/nutrition-physical-activity/dietary-guidelines and https://www.myplate.gov/  ALCOHOL:  Women should limit their alcohol intake to no more than 7 drinks/beers/glasses of wine (combined, not each!) per week. Moderation of alcohol intake to this level decreases your risk of breast cancer and liver damage.  If you are concerned that you may have a problem, or your friends have told you they are concerned about your drinking, there are many resources to help. A well-known program that is free, effective, and available to all people all over the nation is Alcoholics Anonymous.  Check out this site to learn more: https://www.aa.org/   CALCIUM AND VITAMIN D:  Adequate intake of calcium and Vitamin D are recommended for bone health.  You should be getting between 1000-1200 mg of calcium and 800 units of Vitamin D daily between diet and supplements  PAP SMEARS:  Pap smears, to check for cervical cancer or precancers,  have traditionally been  done yearly, scientific advances have shown that most women can have pap smears less often.  However, every woman still should have a physical exam from her gynecologist every year. It will include a breast check, inspection of the vulva and vagina to check for abnormal growths or skin changes, a visual exam of the cervix, and then an exam to evaluate the size and shape of the uterus and ovaries. We will also provide age appropriate advice regarding health maintenance, like when you should have certain vaccines, screening for sexually transmitted diseases, bone density testing, colonoscopy, mammograms, etc.   MAMMOGRAMS:  All women over 40 years old should have a routine mammogram.   COLON CANCER SCREENING: Now recommend starting at age 45. At this time colonoscopy is not covered for routine screening until 50. There are take home tests that can be done between 45-49.   COLONOSCOPY:  Colonoscopy to screen for colon cancer is recommended for all women at age 50.  We know, you hate the idea of the prep.  We agree, BUT, having colon cancer and not knowing it is worse!!  Colon cancer so often starts as a polyp that can be seen and removed at colonscopy, which can quite literally save your life!  And if your first colonoscopy is normal and you have no family history of colon cancer, most women don't have to have it again for 10 years.  Once every ten years, you can do something that may end up saving your life, right?  We will be happy to help you get it scheduled when you are ready.  Be sure to check your insurance coverage so you understand how much it will cost.  It may be covered as a preventative service at no cost, but you should check   your particular policy.      Breast Self-Awareness Breast self-awareness means being familiar with how your breasts look and feel. It involves checking your breasts regularly and reporting any changes to your health care provider. Practicing breast self-awareness is  important. A change in your breasts can be a sign of a serious medical problem. Being familiar with how your breasts look and feel allows you to find any problems early, when treatment is more likely to be successful. All women should practice breast self-awareness, including women who have had breast implants. How to do a breast self-exam One way to learn what is normal for your breasts and whether your breasts are changing is to do a breast self-exam. To do a breast self-exam: Look for Changes  Remove all the clothing above your waist. Stand in front of a mirror in a room with good lighting. Put your hands on your hips. Push your hands firmly downward. Compare your breasts in the mirror. Look for differences between them (asymmetry), such as: Differences in shape. Differences in size. Puckers, dips, and bumps in one breast and not the other. Look at each breast for changes in your skin, such as: Redness. Scaly areas. Look for changes in your nipples, such as: Discharge. Bleeding. Dimpling. Redness. A change in position. Feel for Changes Carefully feel your breasts for lumps and changes. It is best to do this while lying on your back on the floor and again while sitting or standing in the shower or tub with soapy water on your skin. Feel each breast in the following way: Place the arm on the side of the breast you are examining above your head. Feel your breast with the other hand. Start in the nipple area and make  inch (2 cm) overlapping circles to feel your breast. Use the pads of your three middle fingers to do this. Apply light pressure, then medium pressure, then firm pressure. The light pressure will allow you to feel the tissue closest to the skin. The medium pressure will allow you to feel the tissue that is a little deeper. The firm pressure will allow you to feel the tissue close to the ribs. Continue the overlapping circles, moving downward over the breast until you feel your  ribs below your breast. Move one finger-width toward the center of the body. Continue to use the  inch (2 cm) overlapping circles to feel your breast as you move slowly up toward your collarbone. Continue the up and down exam using all three pressures until you reach your armpit.  Write Down What You Find  Write down what is normal for each breast and any changes that you find. Keep a written record with breast changes or normal findings for each breast. By writing this information down, you do not need to depend only on memory for size, tenderness, or location. Write down where you are in your menstrual cycle, if you are still menstruating. If you are having trouble noticing differences in your breasts, do not get discouraged. With time you will become more familiar with the variations in your breasts and more comfortable with the exam. How often should I examine my breasts? Examine your breasts every month. If you are breastfeeding, the best time to examine your breasts is after a feeding or after using a breast pump. If you menstruate, the best time to examine your breasts is 5-7 days after your period is over. During your period, your breasts are lumpier, and it may be more   difficult to notice changes. When should I see my health care provider? See your health care provider if you notice: A change in shape or size of your breasts or nipples. A change in the skin of your breast or nipples, such as a reddened or scaly area. Unusual discharge from your nipples. A lump or thick area that was not there before. Pain in your breasts. Anything that concerns you. Vulvodynia  Vulvodynia is long-lasting (chronic) pain in the external female genitalia (vulva). This condition may also be referred to as vulvar pain. The vulva includestissues surrounding the vaginal opening and the urethra. There are two main types of vulvar pain: Localized vulvar pain. This is pain that affects a specific area of the  vulva. Vulvar vestibulitis syndrome (VVS) is a type of localized vulvar pain in which pain is only felt around the opening (vestibule) of the vagina. Generalized vulvar pain. This is pain that affects: The entire vulva. Multiple areas of the vulva. One side of the vulva. What are the causes? The cause of this condition is often not known. Many factors may contribute to it. Vulvar pain may be a type of nerve pain (neuropathic pain). What increases the risk? You are more likely to develop this condition if you have: Pelvic floor dysfunction. This is a problem in the muscles of your pelvis. A disorder that affects a protein that is involved in inflammation in the body. An increased number of pain receptor nerves throughout the body. A painful bladder. Irritable bowel syndrome (IBS). Fibromyalgia. This is a condition that causes chronic pain in many parts of the body. Restless sleep. Depression. What are the signs or symptoms? The main symptom of this condition is pain that affects part of the vulva or the entire vulva. Pain may: Feel like a burning, tearing, stinging, or sharp, knife-like sensation. Be long-term and ongoing. Come and go (be intermittent). Get worse when touching or putting pressure on the vulva, such as when inserting a tampon, having sex, sitting for a long time, or wearing tight pants. Symptoms may also include: Swelling or redness of the vulva, perineum, or inner thighs. Psychological or emotional distress. How is this diagnosed? This condition may be diagnosed based on: Your symptoms and your medical history. Your health care provider may ask questions about what causes or worsens your pain. A physical exam of your abdomen and pelvis. This may include a cotton swab test. During this test, your health care provider gently touches all areas of your vulva with a cotton swab to determine where you have pain. Tests of your vaginal fluid. These tests may be done to check  for infection. How is this treated? Treatment for this condition depends on the cause and severity of your pain. Treatment may include: Caring for your skin in the vulvar area. Working with a health care provider who specializes in pain. Physical therapy. This may include doing pelvic floor exercises. Counseling (psychotherapy). This may include biofeedback. This is a type of therapy that helps you be more aware of your body's response to pain. It also helps you learn to deal with pain. Complementary medical therapies to help relieve pain, such as: Hypnotherapy. This means being put in a state of altered consciousness (hypnosis). Acupuncture. This is the insertion of needles into certain places on the skin. Surgery to remove affected parts of your vulva. Follow these instructions at home: Clothing Wear cotton underwear. Avoid wearing tight pants or pantyhose. Wash clothing in unscented, mild laundry detergent. Do not  use fabric softeners or dryer sheets. Eating and drinking Avoid caffeine. Avoid foods that are highly processed or high in sugar. Self-care If any soaps, lotions, creams, or ointments cause or worsen your pain, do not use these products on the affected areas. Do not use feminine wipes or douches. Do not use scented body soaps, scented pads, or scented tampons. Do not use hot tubs or take hot baths. Clean your vulvar skin with warm water only and pat the area dry. General instructions Take over-the-counter and prescription medicines only as told by your health care provider. If you have sex, consider using an unscented silicone-based or oil-based lubricant. Keep all follow-up visits. This is important. Contact a health care provider if: Your pain gets worse or does not improve after starting treatment. You develop vaginal discharge that smells bad. Summary Vulvodynia is long-lasting (chronic) pain in the external female genitalia (vulva). This condition may also be  referred to as vulvar pain. The vulva includes tissues surrounding the vaginal opening and the urethra. Symptoms of this condition may include swelling or redness of the vulva, perineum, or inner thighs. The cause of this condition is often not known. Many factors may contribute to it. Vulvar pain may be a type of nerve pain (neuropathic pain). Treatment depends on the cause and severity of your pain. It may include physical therapy, counseling, complementary medical therapies, or working with a pain specialist. Treatment may also include surgery to remove affected parts of your vulva. This information is not intended to replace advice given to you by your health care provider. Make sure you discuss any questions you have with your healthcare provider. Document Revised: 02/10/2020 Document Reviewed: 02/10/2020 Elsevier Patient Education  2022 [employer].  ?National Vulvodynia [employer] - Publishes Vulvodynia: A Self-Help Guide for patients and clinicians.

## 2021-03-08 ENCOUNTER — Ambulatory Visit (INDEPENDENT_AMBULATORY_CARE_PROVIDER_SITE_OTHER): Payer: Managed Care, Other (non HMO) | Admitting: Obstetrics and Gynecology

## 2021-03-08 ENCOUNTER — Other Ambulatory Visit: Payer: Self-pay

## 2021-03-08 ENCOUNTER — Encounter: Payer: Self-pay | Admitting: Obstetrics and Gynecology

## 2021-03-08 VITALS — BP 118/62 | HR 75 | Ht 66.75 in | Wt 127.0 lb

## 2021-03-08 DIAGNOSIS — L292 Pruritus vulvae: Secondary | ICD-10-CM | POA: Diagnosis not present

## 2021-03-08 NOTE — Progress Notes (Signed)
GYNECOLOGY  VISIT   HPI: 50 y.o.   Single White or Caucasian Not Hispanic or Latino  female   G0P0 with Patient's last menstrual period was 10/30/2010.   here for two week follow up for vulvar skin changes and tenderness. Patient states that the area on her vulvar that was tender seems to less tender with use of the betamethasone ointment.  At the time of her annual exam she c/o occasional vulvar pruritus. On exam: on the lower outer left labia minora is a 1.5 area of skin changes, erythema/pigment change. Minimal agglutination between the left labia minora and majora. She had a negative wet prep and was treated with a 2 week course of steroid ointment. Her vulvar skin feels better, not itchy, less tender.   GYNECOLOGIC HISTORY: Patient's last menstrual period was 10/30/2010. Contraception:Pmp Menopausal hormone therapy: none         OB History     Gravida  0   Para      Term      Preterm      AB      Living         SAB      IAB      Ectopic      Multiple      Live Births                 Patient Active Problem List   Diagnosis Date Noted   Mucosal abnormality of intestine question Crohn's of terminal ileum and right colon 05/05/2020   Premature ovarian failure     Past Medical History:  Diagnosis Date   Plantar fasciitis Right heel   Premature ovarian failure     Past Surgical History:  Procedure Laterality Date   COLONOSCOPY W/ BIOPSIES      Current Outpatient Medications  Medication Sig Dispense Refill   Ascorbic Acid (VITAMIN C PO) Take by mouth.     betamethasone valerate ointment (VALISONE) 0.1 % Apply 1 application topically 2 (two) times daily. 30 g 0   Calcium Carb-Cholecalciferol (CALCIUM 1000 + D PO) Take by mouth.     Calcium Carbonate-Vitamin D (CALCIUM + D PO) Take by mouth.     Cyanocobalamin (VITAMIN B-12 CR PO) Take by mouth.     FLUZONE QUADRIVALENT 0.5 ML injection ADM 0.5ML IM UTD  0   Multiple Vitamin (MULTIVITAMIN) tablet Take  1 tablet by mouth daily.     psyllium (METAMUCIL) 58.6 % packet Take 1 packet by mouth daily.     No current facility-administered medications for this visit.     ALLERGIES: Bee venom  Family History  Problem Relation Age of Onset   Breast cancer Maternal Grandmother 43   Colon polyps Maternal Grandmother    Hypertension Maternal Grandfather    Osteoporosis Mother    Colon polyps Maternal Aunt    Colon cancer Neg Hx    Esophageal cancer Neg Hx    Stomach cancer Neg Hx     Social History   Socioeconomic History   Marital status: Single    Spouse name: Not on file   Number of children: Not on file   Years of education: Not on file   Highest education level: Not on file  Occupational History   Occupation: HEALTH CARE BILLING SPECILALIST    Employer: [employer]  Tobacco Use   Smoking status: Never   Smokeless tobacco: Never  Vaping Use   Vaping Use: Never used  Substance and Sexual Activity  Alcohol use: No    Alcohol/week: 0.0 standard drinks   Drug use: No   Sexual activity: Never    Comment: Virgin  Other Topics Concern   Not on file  Social History Narrative   Single, lives with her mother   Works at [employer] as a Holiday representative   No alcohol tobacco or drug use   Social Determinants of Corporate investment banker Strain: Not on file  Food Insecurity: Not on file  Transportation Needs: Not on file  Physical Activity: Not on file  Stress: Not on file  Social Connections: Not on file  Intimate Partner Violence: Not on file    Review of Systems  All other systems reviewed and are negative.  PHYSICAL EXAMINATION:    BP 118/62   Pulse 75   Ht 5' 6.75" (1.695 m)   Wt 127 lb (57.6 kg)   LMP 10/30/2010   SpO2 98%   BMI 20.04 kg/m     General appearance: alert, cooperative and appears stated age  Pelvic: External genitalia:  no lesions, mildly atrophic, prior skin changes have resolved.               Urethra:  normal appearing urethra with no  masses, tenderness or lesions               Chaperone, Carolynn Serve,  was present for exam.  1. Vulvar pruritus Symptoms and skin changes have resolved with treatment She has information on vulvar skin care

## 2022-01-15 ENCOUNTER — Other Ambulatory Visit: Payer: Self-pay | Admitting: Obstetrics and Gynecology

## 2022-01-15 DIAGNOSIS — Z1231 Encounter for screening mammogram for malignant neoplasm of breast: Secondary | ICD-10-CM

## 2022-02-12 NOTE — Progress Notes (Unsigned)
51 y.o. G0P0 Single White or Caucasian Not Hispanic or Latino female here for annual exam.      Patient's last menstrual period was 10/30/2010.          Sexually active: {yes no:314532}  The current method of family planning is {contraception:315051}.    Exercising: {yes no:314532}  {types:19826} Smoker:  {YES NO:22349}  Health Maintenance: Pap:  12-24-18 normal, no hpv testing done.  History of abnormal Pap:  no MMG:  02/16/2021 density C Bi-rads 1 neg  BMD:    01-26-20 normal Colonoscopy:  03-01-20 diverticula TDaP:  unsure  Gardasil: n/a   reports that she has never smoked. She has never used smokeless tobacco. She reports that she does not drink alcohol and does not use drugs.  Past Medical History:  Diagnosis Date   Plantar fasciitis Right heel   Premature ovarian failure     Past Surgical History:  Procedure Laterality Date   COLONOSCOPY W/ BIOPSIES      Current Outpatient Medications  Medication Sig Dispense Refill   Ascorbic Acid (VITAMIN C PO) Take by mouth.     betamethasone valerate ointment (VALISONE) 0.1 % Apply 1 application topically 2 (two) times daily. 30 g 0   Calcium Carb-Cholecalciferol (CALCIUM 1000 + D PO) Take by mouth.     Calcium Carbonate-Vitamin D (CALCIUM + D PO) Take by mouth.     Cyanocobalamin (VITAMIN B-12 CR PO) Take by mouth.     FLUZONE QUADRIVALENT 0.5 ML injection ADM 0.5ML IM UTD  0   Multiple Vitamin (MULTIVITAMIN) tablet Take 1 tablet by mouth daily.     psyllium (METAMUCIL) 58.6 % packet Take 1 packet by mouth daily.     No current facility-administered medications for this visit.    Family History  Problem Relation Age of Onset   Breast cancer Maternal Grandmother 70   Colon polyps Maternal Grandmother    Hypertension Maternal Grandfather    Osteoporosis Mother    Colon polyps Maternal Aunt    Colon cancer Neg Hx    Esophageal cancer Neg Hx    Stomach cancer Neg Hx     Review of Systems  Exam:   LMP 10/30/2010   Weight  change: @WEIGHTCHANGE @ Height:      Ht Readings from Last 3 Encounters:  03/08/21 5' 6.75" (1.695 m)  02/21/21 5' 5.5" (1.664 m)  05/05/20 5\' 6"  (1.676 m)    General appearance: alert, cooperative and appears stated age Head: Normocephalic, without obvious abnormality, atraumatic Neck: no adenopathy, supple, symmetrical, trachea midline and thyroid {CHL AMB PHY EX THYROID NORM DEFAULT:[phone]::"normal to inspection and palpation"} Lungs: clear to auscultation bilaterally Cardiovascular: regular rate and rhythm Breasts: {Exam; breast:13139::"normal appearance, no masses or tenderness"} Abdomen: soft, non-tender; non distended,  no masses,  no organomegaly Extremities: extremities normal, atraumatic, no cyanosis or edema Skin: Skin color, texture, turgor normal. No rashes or lesions Lymph nodes: Cervical, supraclavicular, and axillary nodes normal. No abnormal inguinal nodes palpated Neurologic: Grossly normal   Pelvic: External genitalia:  no lesions              Urethra:  normal appearing urethra with no masses, tenderness or lesions              Bartholins and Skenes: normal                 Vagina: normal appearing vagina with normal color and discharge, no lesions  Cervix: {CHL AMB PHY EX CERVIX NORM DEFAULT:[phone]::"no lesions"}               Bimanual Exam:  Uterus:  {CHL AMB PHY EX UTERUS NORM [phone]::"normal size, contour, position, consistency, mobility, non-tender"}              Adnexa: {CHL AMB PHY EX ADNEXA NO MASS DEFAULT:[phone]::"no mass, fullness, tenderness"}               Rectovaginal: Confirms               Anus:  normal sphincter tone, no lesions  *** chaperoned for the exam.  A:  Well Woman with normal exam  P:

## 2022-02-20 ENCOUNTER — Ambulatory Visit: Payer: Managed Care, Other (non HMO)

## 2022-02-21 ENCOUNTER — Ambulatory Visit
Admission: RE | Admit: 2022-02-21 | Discharge: 2022-02-21 | Disposition: A | Discharge status: Home / Self Care | Payer: Managed Care, Other (non HMO) | Source: Ambulatory Visit | Attending: Obstetrics and Gynecology | Admitting: Obstetrics and Gynecology

## 2022-02-21 DIAGNOSIS — Z1231 Encounter for screening mammogram for malignant neoplasm of breast: Secondary | ICD-10-CM

## 2022-02-26 ENCOUNTER — Other Ambulatory Visit (HOSPITAL_COMMUNITY)
Admission: RE | Admit: 2022-02-26 | Discharge: 2022-02-26 | Disposition: A | Discharge status: Home / Self Care | Payer: Managed Care, Other (non HMO) | Source: Ambulatory Visit | Attending: Obstetrics and Gynecology | Admitting: Obstetrics and Gynecology

## 2022-02-26 ENCOUNTER — Ambulatory Visit (INDEPENDENT_AMBULATORY_CARE_PROVIDER_SITE_OTHER): Payer: Managed Care, Other (non HMO) | Admitting: Obstetrics and Gynecology

## 2022-02-26 ENCOUNTER — Encounter: Payer: Self-pay | Admitting: Obstetrics and Gynecology

## 2022-02-26 VITALS — BP 110/70 | HR 78 | Resp 20 | Ht 65.55 in | Wt 122.6 lb

## 2022-02-26 DIAGNOSIS — N9089 Other specified noninflammatory disorders of vulva and perineum: Secondary | ICD-10-CM

## 2022-02-26 DIAGNOSIS — Z124 Encounter for screening for malignant neoplasm of cervix: Secondary | ICD-10-CM | POA: Insufficient documentation

## 2022-02-26 DIAGNOSIS — Z23 Encounter for immunization: Secondary | ICD-10-CM | POA: Diagnosis not present

## 2022-02-26 DIAGNOSIS — Z01419 Encounter for gynecological examination (general) (routine) without abnormal findings: Secondary | ICD-10-CM | POA: Diagnosis not present

## 2022-02-27 LAB — CYTOLOGY - PAP
Comment: NEGATIVE
Diagnosis: NEGATIVE
High risk HPV: NEGATIVE

## 2022-03-30 ENCOUNTER — Encounter: Payer: Self-pay | Admitting: Internal Medicine

## 2022-06-26 ENCOUNTER — Ambulatory Visit (INDEPENDENT_AMBULATORY_CARE_PROVIDER_SITE_OTHER): Payer: Managed Care, Other (non HMO) | Admitting: Podiatry

## 2022-06-26 DIAGNOSIS — S91201A Unspecified open wound of right great toe with damage to nail, initial encounter: Secondary | ICD-10-CM

## 2022-06-26 DIAGNOSIS — S91202A Unspecified open wound of left great toe with damage to nail, initial encounter: Secondary | ICD-10-CM

## 2022-06-26 NOTE — Progress Notes (Signed)
   Chief Complaint  Patient presents with   Nail Problem    Patient is here for bilateral great toe, the patient states that the nails are not growing right.    HPI: 51 y.o. female presenting today as a new patient for evaluation of disfiguration of the bilateral toenails.  Patient states that the distal portion of the toenails are peeling off.  She denies a history of injury.  She says that this also occurred the previous summer.  Past Medical History:  Diagnosis Date   Plantar fasciitis Right heel   Premature ovarian failure     Past Surgical History:  Procedure Laterality Date   COLONOSCOPY W/ BIOPSIES      Allergies  Allergen Reactions   Bee Venom Swelling    "BEE STINGS"     Physical Exam: General: The patient is alert and oriented x3 in no acute distress.  Dermatology: There is an old nail plate overlying the distal half of new healthy undergrowth of a new nail plate to the bilateral great toenails.  No drainage.  No clinical indication of infection.  Vascular: Palpable pedal pulses bilaterally. Capillary refill within normal limits.  Negative for any significant edema or erythema  Neurological: Light touch and protective threshold grossly intact  Musculoskeletal Exam: No pedal deformities noted  Assessment: 1.  Loosely adhered superficial nail plate bilateral great toes with healthy growth and new nail along the base   Plan of Care:  1. Patient evaluated.  2.  Debridement of the superficial distal half of the nail plate was performed today using a nail nipper without incident or bleeding.  The patient did feel some relief.  The remaining proximal portion of the healthy underlying nail plate was smoothed with a Dremel. 3.  Recommend good supportive shoes that do not irritate the bilateral great toes 4.  Return to clinic as needed  *Works from home.  [facility]     Edrick Kins, DPM [facility]  Dr. Edrick Kins, DPM    [address], [place] [address]                Office [phone]  Fax [phone]

## 2023-01-08 ENCOUNTER — Other Ambulatory Visit: Payer: Self-pay | Admitting: Obstetrics & Gynecology

## 2023-01-08 DIAGNOSIS — Z0000 Encounter for general adult medical examination without abnormal findings: Secondary | ICD-10-CM

## 2023-02-25 ENCOUNTER — Ambulatory Visit
Admission: RE | Admit: 2023-02-25 | Discharge: 2023-02-25 | Disposition: A | Discharge status: Home / Self Care | Payer: Managed Care, Other (non HMO) | Source: Ambulatory Visit | Attending: Obstetrics & Gynecology | Admitting: Obstetrics & Gynecology

## 2023-02-25 DIAGNOSIS — Z0000 Encounter for general adult medical examination without abnormal findings: Secondary | ICD-10-CM

## 2023-03-01 ENCOUNTER — Encounter: Payer: Self-pay | Admitting: Obstetrics & Gynecology

## 2023-03-01 ENCOUNTER — Ambulatory Visit (INDEPENDENT_AMBULATORY_CARE_PROVIDER_SITE_OTHER): Payer: Managed Care, Other (non HMO) | Admitting: Obstetrics & Gynecology

## 2023-03-01 VITALS — BP 128/84 | HR 95 | Ht 66.5 in | Wt 121.0 lb

## 2023-03-01 DIAGNOSIS — Z01419 Encounter for gynecological examination (general) (routine) without abnormal findings: Secondary | ICD-10-CM | POA: Diagnosis not present

## 2023-03-01 DIAGNOSIS — N94819 Vulvodynia, unspecified: Secondary | ICD-10-CM | POA: Diagnosis not present

## 2023-03-01 DIAGNOSIS — E2839 Other primary ovarian failure: Secondary | ICD-10-CM

## 2023-03-01 NOTE — Progress Notes (Signed)
Kari Powell [facility] 11/10/1970 [id number]   History:    52 y.o. G0 Single.  Virgin.  RP:  Established patient presenting for annual gyn exam   HPI: Postmenopause x 2012, premature ovarian failure, well on no HRT.  No PMB.  No pelvic pain.  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 Previously given a script for lidocaine. Only occasional vulvar itching, improved with Dove.  Last Pap Neg 02/2022.  No h/o abnormal Pap, no indication to repeat a Pap at this time.  Breasts normal. Mammo Neg 02/2023. BMD:  01-26-20 normal.  Colonoscopy:  03-01-20 diverticula, biopsy with chronic colon inflammation. Needs f/u in 5 years. BMI 19.24. Health labs at work.  Past medical history,surgical history, family history and social history were all reviewed and documented in the EPIC chart.  Gynecologic History Patient's last menstrual period was 10/30/2010.  Obstetric History OB History  Gravida Para Term Preterm AB Living  0            SAB IAB Ectopic Multiple Live Births                ROS: A ROS was performed and pertinent positives and negatives are included in the history. GENERAL: No fevers or chills. HEENT: No change in vision, no earache, sore throat or sinus congestion. NECK: No pain or stiffness. CARDIOVASCULAR: No chest pain or pressure. No palpitations. PULMONARY: No shortness of breath, cough or wheeze. GASTROINTESTINAL: No abdominal pain, nausea, vomiting or diarrhea, melena or bright red blood per rectum. GENITOURINARY: No urinary frequency, urgency, hesitancy or dysuria. MUSCULOSKELETAL: No joint or muscle pain, no back pain, no recent trauma. DERMATOLOGIC: No rash, no itching, no lesions. ENDOCRINE: No polyuria, polydipsia, no heat or cold intolerance. No recent change in weight. HEMATOLOGICAL: No anemia or easy bruising or bleeding. NEUROLOGIC: No headache, seizures, numbness, tingling or weakness. PSYCHIATRIC: No depression, no loss of interest in normal  activity or change in sleep pattern.     Exam:   BP 128/84 (BP Location: Right Arm, Patient Position: Sitting, Cuff Size: Normal)   Pulse 95   Ht 5' 6.5" (1.689 m)   Wt 121 lb (54.9 kg)   LMP 10/30/2010   SpO2 98%   BMI 19.24 kg/m   Body mass index is 19.24 kg/m.  General appearance : Well developed well nourished female. No acute distress HEENT: Eyes: no retinal hemorrhage or exudates,  Neck supple, trachea midline, no carotid bruits, no thyroidmegaly Lungs: Clear to auscultation, no rhonchi or wheezes, or rib retractions  Heart: Regular rate and rhythm, no murmurs or gallops Breast:Examined in sitting and supine position were symmetrical in appearance, no palpable masses or tenderness,  no skin retraction, no nipple inversion, no nipple discharge, no skin discoloration, no axillary or supraclavicular lymphadenopathy Abdomen: no palpable masses or tenderness, no rebound or guarding Extremities: no edema or skin discoloration or tenderness  Pelvic: Vulva: Normal             Bimanual exam with 1 finger, hymen intact, tight on finger and therefore uncomfortable.  Vagina: No gross lesions or discharge  Cervix: No gross lesions or discharge  Uterus  RV, normal size, shape and consistency, non-tender and mobile  Adnexa  Without masses or tenderness  Anus: Normal   Assessment/Plan:  52 y.o. female for annual exam   1. Well woman exam Postmenopause x 2012, premature ovarian failure, well on no HRT.  No PMB.  No pelvic pain.  H/O  vulvodynia, not sexually active, virgin, only an issue when she needs an exam. Previously given a script for lidocaine. Only occasional vulvar itching, improved with Dove.  Last Pap Neg 02/2022.  No h/o abnormal Pap, no indication to repeat a Pap at this time.  Breasts normal. Mammo Neg 02/2023. BMD:  01-26-20 normal.  Colonoscopy:  03-01-20 diverticula, biopsy with chronic colon inflammation. Needs f/u in 5 years. BMI 19.24. Health labs at work.  2. Premature  ovarian failure Postmenopause x 2012, premature ovarian failure, well on no HRT.  No PMB.  No pelvic pain.  BD normal 01/2020.  3. 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 Previously given a script for lidocaine. Only occasional vulvar itching, improved with Dove.  Recommend a Trans-abdominal Pelvic US with full bladder at next Annual Gyn exam, rather than a painful bimanual exam.  Recommend every 2 years if asymptomatic. - US Pelvis Complete; Future   Genia Del MD, 9:35 AM

## 2023-10-22 IMAGING — MG MM DIGITAL SCREENING BILAT W/ TOMO AND CAD
8 series · 9 of 24 positions shown · non-contrast
Comparison: Previous exam(s).

CLINICAL DATA: Screening.

EXAM:
DIGITAL SCREENING BILATERAL MAMMOGRAM WITH TOMOSYNTHESIS AND CAD
TECHNIQUE: Bilateral screening digital craniocaudal and mediolateral oblique
mammograms were obtained. Bilateral screening digital breast
tomosynthesis was performed. The images were evaluated with
computer-aided detection.

[L MLO synth-2D]
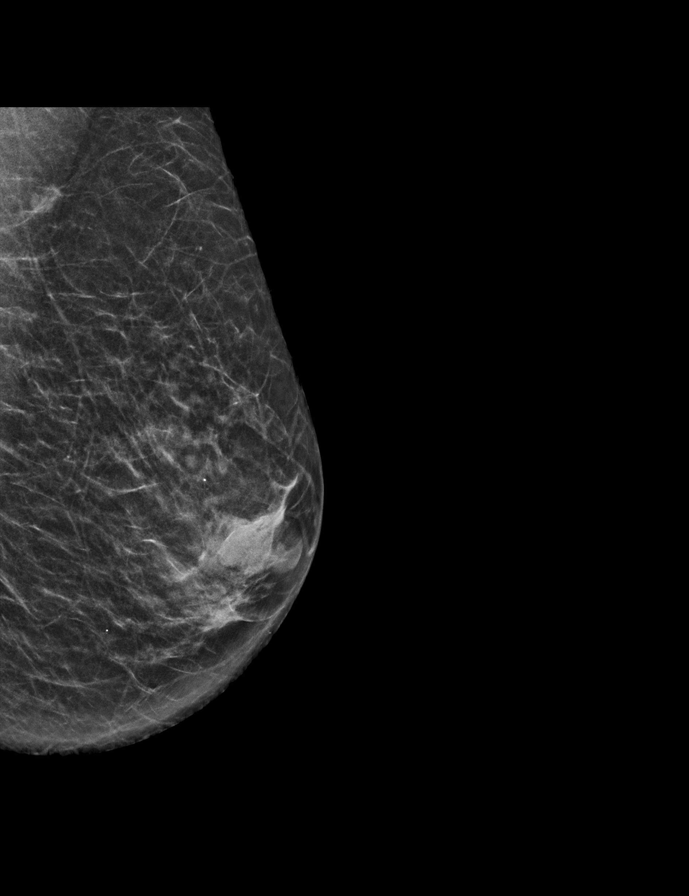

[R CC synth-2D]
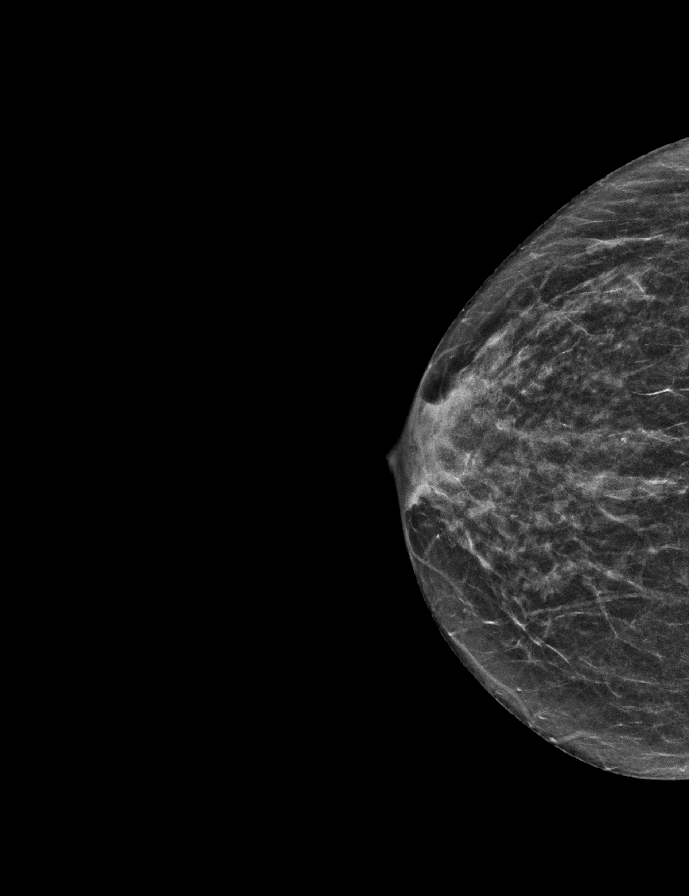

[R MLO synth-2D]
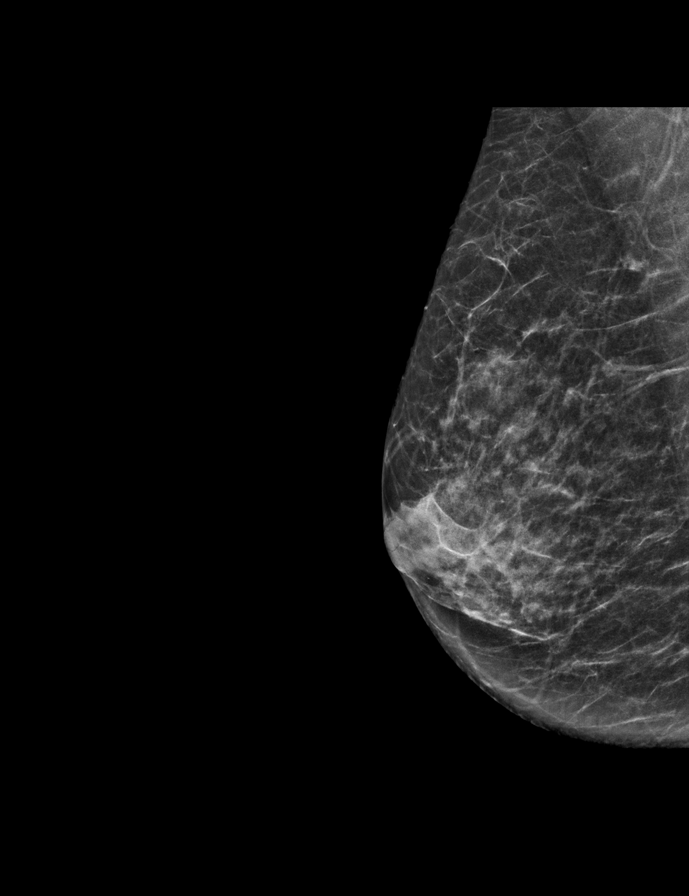

[L CC synth-2D]
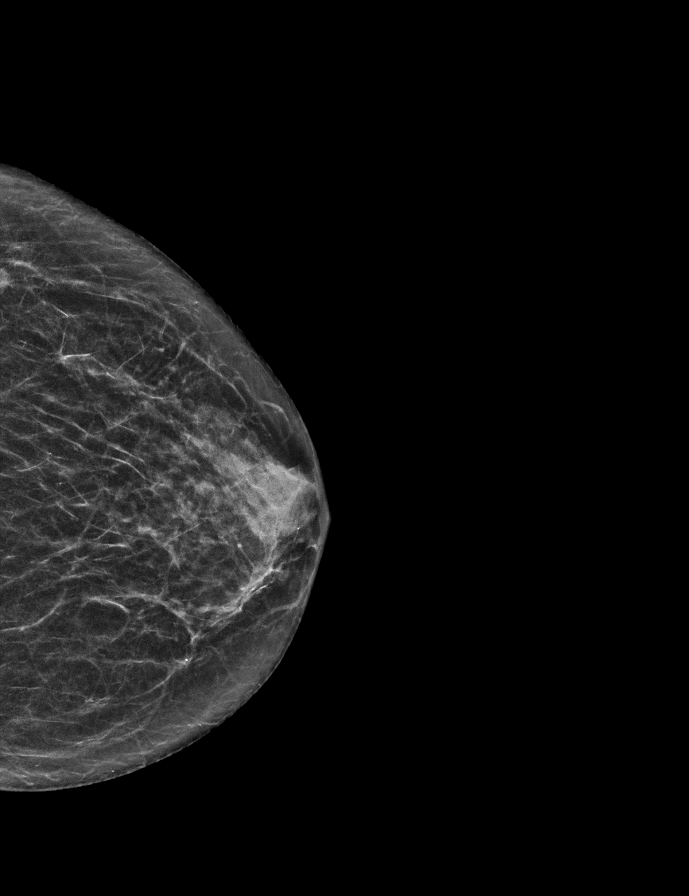

[L MLO tomo · 2 of 45 frames shown]
[frame 15/45]
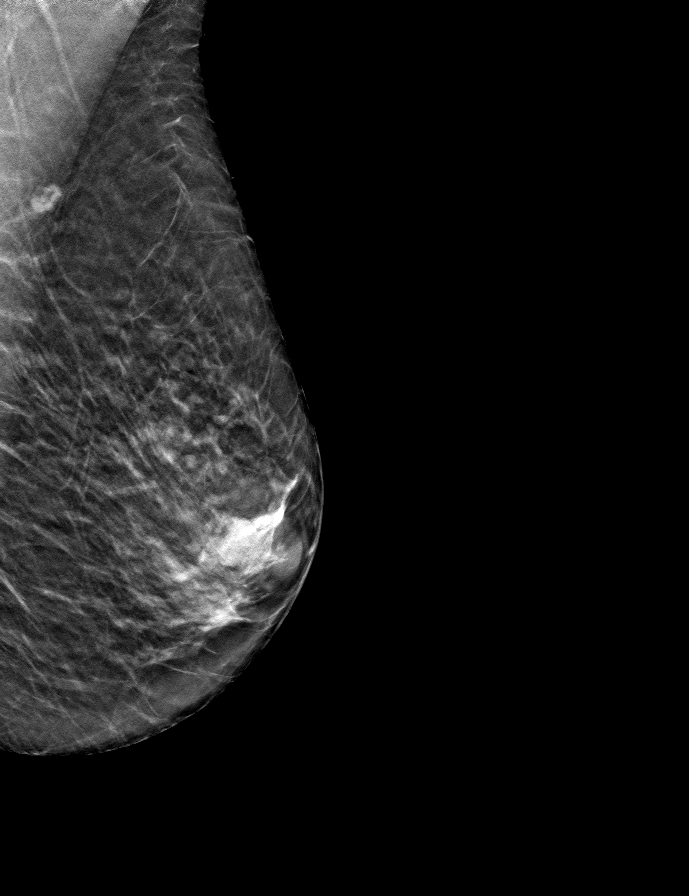
[frame 23/45]
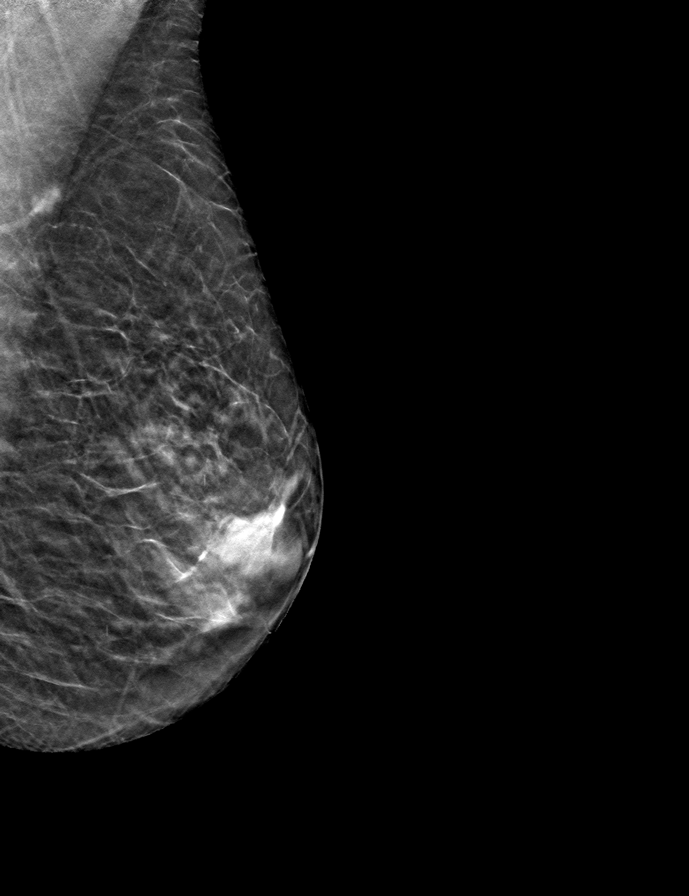

[R MLO tomo · tomo slice 26/51.0]
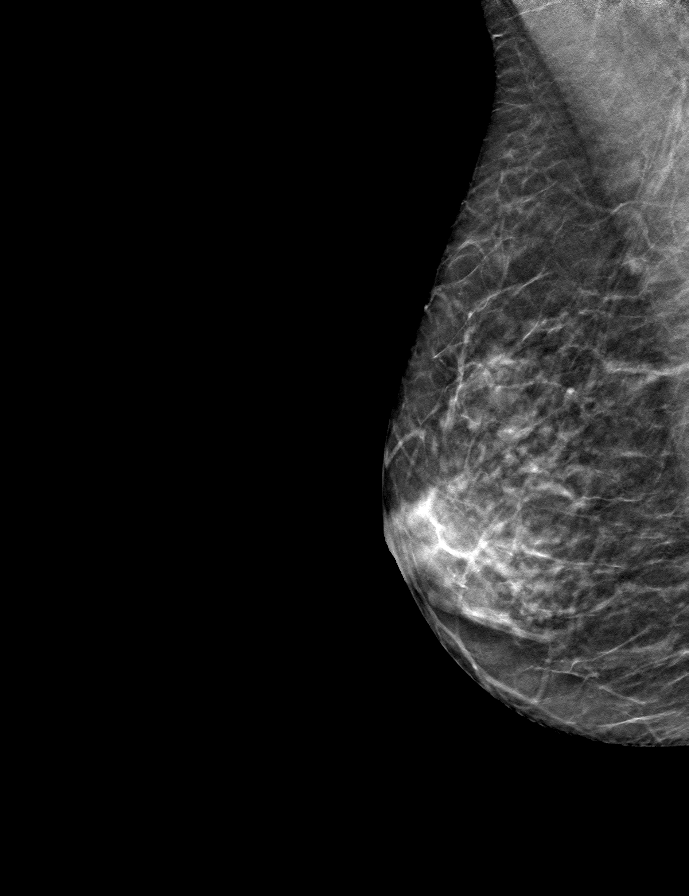

[L CC tomo · tomo slice 23/46.0]
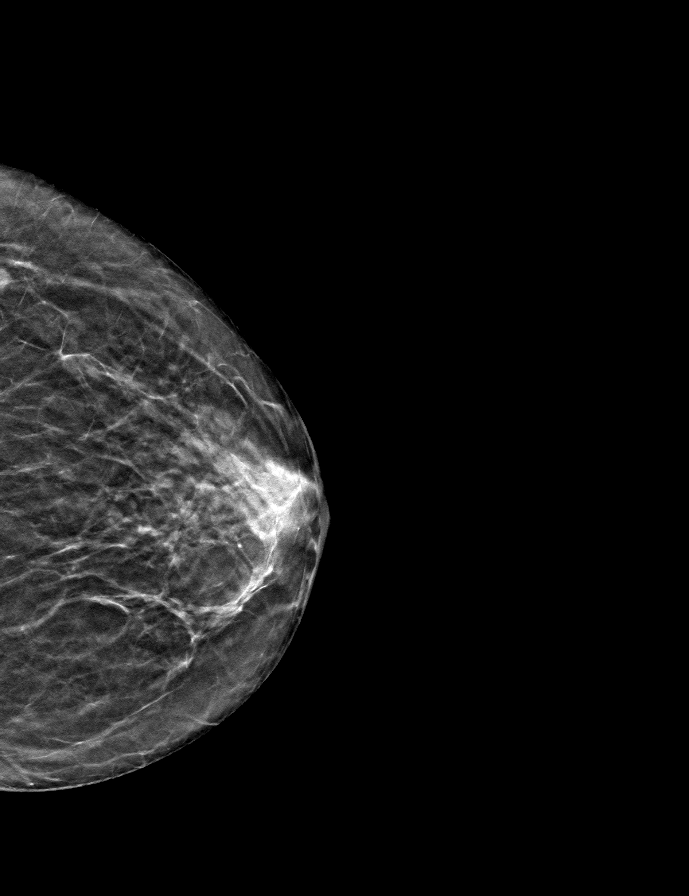

[R CC tomo · tomo slice 22/43.0]
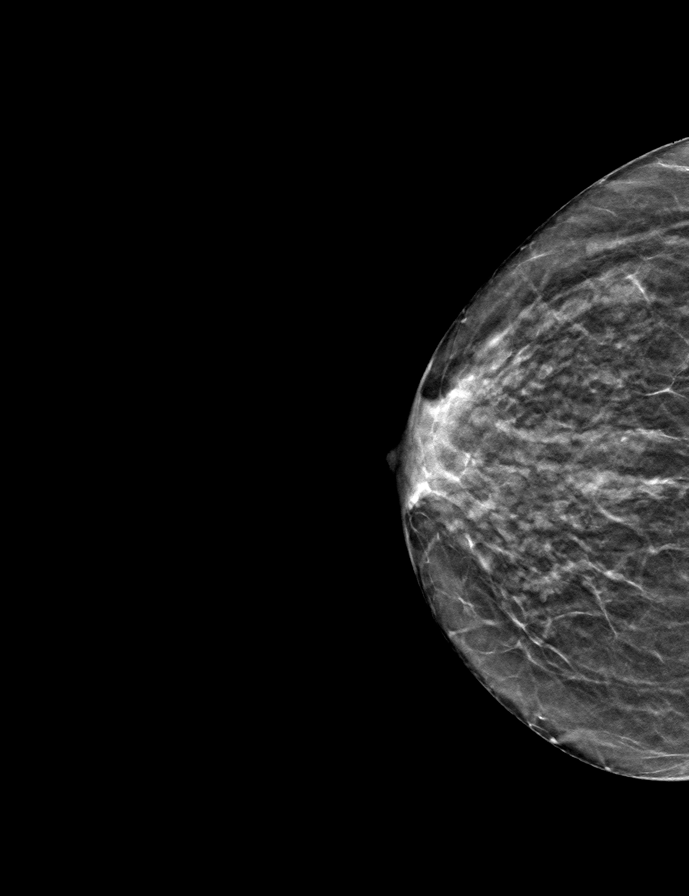

[9 of 24 positions shown; findings below may reference images not displayed]

ACR Breast Density Category c: The breast tissue is heterogeneously
dense, which may obscure small masses.
FINDINGS: There are no findings suspicious for malignancy.
IMPRESSION: No mammographic evidence of malignancy. A result letter of this
screening mammogram will be mailed directly to the patient.

RECOMMENDATION:
Screening mammogram in one year. (Code:[id number])

BI-RADS CATEGORY  1: Negative.

## 2023-12-10 ENCOUNTER — Telehealth: Payer: Self-pay | Admitting: Obstetrics and Gynecology

## 2023-12-10 NOTE — Telephone Encounter (Signed)
-----   Message from Nurse Chapman Fitch sent at 12/10/2023 12:01 PM EDT ----- Regarding: FW: [place] [facility] -please review.   [facility] ----- Message ----- From: Jerilynn Mages Sent: 12/10/2023   9:03 AM EDT To: [facility] Triage Subject: [place]                                             Recommend a Trans-abdominal Pelvic US with full bladder at next Annual Gyn exam, rather than a painful bimanual exam. Recommend every 2 years if asymptomatic. Per Lavoie  Patient coming in July 1 for [place] and Kari Powell please enter [place] order. Thank you

## 2024-01-13 ENCOUNTER — Other Ambulatory Visit: Payer: Self-pay | Admitting: Nurse Practitioner

## 2024-01-13 DIAGNOSIS — Z1231 Encounter for screening mammogram for malignant neoplasm of breast: Secondary | ICD-10-CM

## 2024-02-27 ENCOUNTER — Ambulatory Visit
Admission: RE | Admit: 2024-02-27 | Discharge: 2024-02-27 | Disposition: A | Discharge status: Home / Self Care | Source: Ambulatory Visit | Attending: Nurse Practitioner | Admitting: Nurse Practitioner

## 2024-02-27 DIAGNOSIS — Z1231 Encounter for screening mammogram for malignant neoplasm of breast: Secondary | ICD-10-CM

## 2024-03-03 ENCOUNTER — Other Ambulatory Visit: Admitting: Obstetrics and Gynecology

## 2024-03-03 ENCOUNTER — Encounter: Payer: Self-pay | Admitting: Obstetrics and Gynecology

## 2024-03-03 ENCOUNTER — Other Ambulatory Visit

## 2024-03-03 ENCOUNTER — Ambulatory Visit: Admitting: Obstetrics and Gynecology

## 2024-03-03 VITALS — BP 98/64 | HR 66 | Temp 97.7°F | Ht 66.5 in | Wt 121.0 lb

## 2024-03-03 DIAGNOSIS — Z01419 Encounter for gynecological examination (general) (routine) without abnormal findings: Secondary | ICD-10-CM | POA: Diagnosis not present

## 2024-03-03 DIAGNOSIS — N94819 Vulvodynia, unspecified: Secondary | ICD-10-CM

## 2024-03-03 DIAGNOSIS — Z1331 Encounter for screening for depression: Secondary | ICD-10-CM | POA: Diagnosis not present

## 2024-03-03 NOTE — Patient Instructions (Signed)
 Kari Buttner, PA [address] [place], [place], [place] [address]  For patients under 50-55yo, I recommend 1200mg  calcium daily and 600IU of vitamin D  daily. For patients over 55yo, I recommend 1200mg  calcium daily and 800IU of vitamin D  daily.  Health Maintenance, Female Adopting a healthy lifestyle and getting preventive care are important in promoting health and wellness. Ask your health care provider about: The right schedule for you to have regular tests and exams. Things you can do on your own to prevent diseases and keep yourself healthy. What should I know about diet, weight, and exercise? Eat a healthy diet  Eat a diet that includes plenty of vegetables, fruits, low-fat dairy products, and lean protein. Do not eat a lot of foods that are high in solid fats, added sugars, or sodium. Maintain a healthy weight Body mass index (BMI) is used to identify weight problems. It estimates body fat based on height and weight. Your health care provider can help determine your BMI and help you achieve or maintain a healthy weight. Get regular exercise Get regular exercise. This is one of the most important things you can do for your health. Most adults should: Exercise for at least 150 minutes each week. The exercise should increase your heart rate and make you sweat (moderate-intensity exercise). Do strengthening exercises at least twice a week. This is in addition to the moderate-intensity exercise. Spend less time sitting. Even light physical activity can be beneficial. Watch cholesterol and blood lipids Have your blood tested for lipids and cholesterol at 55 years of age, then have this test every 5 years. Have your cholesterol levels checked more often if: Your lipid or cholesterol levels are high. You are older than 55 years of age. You are at high risk for heart disease. What should I know about cancer screening? Depending on your health history and family history, you may need to have  cancer screening at various ages. This may include screening for: Breast cancer. Cervical cancer. Colorectal cancer. Skin cancer. Lung cancer. What should I know about heart disease, diabetes, and high blood pressure? Blood pressure and heart disease High blood pressure causes heart disease and increases the risk of stroke. This is more likely to develop in people who have high blood pressure readings or are overweight. Have your blood pressure checked: Every 3-5 years if you are 44-30 years of age. Every year if you are 42 years old or older. Diabetes Have regular diabetes screenings. This checks your fasting blood sugar level. Have the screening done: Once every three years after age 64 if you are at a normal weight and have a low risk for diabetes. More often and at a younger age if you are overweight or have a high risk for diabetes. What should I know about preventing infection? Hepatitis B If you have a higher risk for hepatitis B, you should be screened for this virus. Talk with your health care provider to find out if you are at risk for hepatitis B infection. Hepatitis C Testing is recommended for: Everyone born from [birth year] through 1967. Anyone with known risk factors for hepatitis C. Sexually transmitted infections (STIs) Get screened for STIs, including gonorrhea and chlamydia, if: You are sexually active and are younger than 56 years of age. You are older than 55 years of age and your health care provider tells you that you are at risk for this type of infection. Your sexual activity has changed since you were last screened, and you are at increased  risk for chlamydia or gonorrhea. Ask your health care provider if you are at risk. Ask your health care provider about whether you are at high risk for HIV. Your health care provider may recommend a prescription medicine to help prevent HIV infection. If you choose to take medicine to prevent HIV, you should first get tested for HIV.  You should then be tested every 3 months for as long as you are taking the medicine. Osteoporosis and menopause Osteoporosis is a disease in which the bones lose minerals and strength with aging. This can result in bone fractures. If you are 82 years old or older, or if you are at risk for osteoporosis and fractures, ask your health care provider if you should: Be screened for bone loss. Take a calcium or vitamin D  supplement to lower your risk of fractures. Be given hormone replacement therapy (HRT) to treat symptoms of menopause. Follow these instructions at home: Alcohol use Do not drink alcohol if: Your health care provider tells you not to drink. You are pregnant, may be pregnant, or are planning to become pregnant. If you drink alcohol: Limit how much you have to: 0-1 drink a day. Know how much alcohol is in your drink. In the [place] one drink equals one 12 oz bottle of beer (355 mL), one 5 oz glass of wine (148 mL), or one 1 oz glass of hard liquor (44 mL). Lifestyle Do not use any products that contain nicotine or tobacco. These products include cigarettes, chewing tobacco, and vaping devices, such as e-cigarettes. If you need help quitting, ask your health care provider. Do not use street drugs. Do not share needles. Ask your health care provider for help if you need support or information about quitting drugs. General instructions Schedule regular health, dental, and eye exams. Stay current with your vaccines. Tell your health care provider if: You often feel depressed. You have ever been abused or do not feel safe at home. Summary Adopting a healthy lifestyle and getting preventive care are important in promoting health and wellness. Follow your health care provider's instructions about healthy diet, exercising, and getting tested or screened for diseases. Follow your health care provider's instructions on monitoring your cholesterol and blood pressure. This information is  not intended to replace advice given to you by your health care provider. Make sure you discuss any questions you have with your health care provider. Document Revised: 01/09/2021 Document Reviewed: 01/09/2021 Elsevier Patient Education  2024 [employer].

## 2024-03-03 NOTE — Progress Notes (Signed)
 53 y.o. G0P0 female with POI (2012) here for annual exam. Single, virgin.  Patient's last menstrual period was 10/30/2010.   She reports painful vulva to touch. 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   Works for [employer], wants labs with them.  Abnormal bleeding: none Pelvic discharge or pain: none Breast mass, nipple discharge or skin changes : none  Sexually active: No Birth control: Postmenopause Last PAP:     Component Value Date/Time   DIAGPAP  02/26/2022 1420    - Negative for intraepithelial lesion or malignancy (NILM)   HPVHIGH Negative 02/26/2022 1420   ADEQPAP  02/26/2022 1420    Satisfactory for evaluation. The presence or absence of an   ADEQPAP  02/26/2022 1420    endocervical/transformation zone component cannot be determined because   ADEQPAP of atrophy. 02/26/2022 1420   Last mammogram: 02/07/24 Bi-Rads 1, B Last colonoscopy: 03/01/20, diverticula, biopsy with chronic colon inflammation > 5 year recall DXA: 01/26/20 normal  Exercising: Yes, walking dog daily Smoker: No  Flowsheet Row Office Visit from 03/03/2024 in [facility]  PHQ-2 Total Score 0      GYN HISTORY: Vulvodynia  OB History  Gravida Para Term Preterm AB Living  0       SAB IAB Ectopic Multiple Live Births         Past Medical History:  Diagnosis Date   Plantar fasciitis Right heel   Premature ovarian failure    Past Surgical History:  Procedure Laterality Date   COLONOSCOPY W/ BIOPSIES     Current Outpatient Medications on File Prior to Visit  Medication Sig Dispense Refill   Ascorbic Acid (VITAMIN C PO) Take by mouth.     Calcium Carbonate-Vitamin D  (CALCIUM + D PO) Take by mouth.     Cyanocobalamin (VITAMIN B-12 CR PO) Take by mouth.     FLUZONE QUADRIVALENT 0.5 ML injection ADM 0.5ML IM UTD  0   Multiple Vitamin (MULTIVITAMIN) tablet Take 1 tablet by mouth daily.     psyllium (METAMUCIL) 58.6 % packet  Take 1 packet by mouth daily.     Calcium Carb-Cholecalciferol (CALCIUM 1000 + D PO) Take by mouth.     No current facility-administered medications on file prior to visit.   Social History   Socioeconomic History   Marital status: Single    Spouse name: Not on file   Number of children: Not on file   Years of education: Not on file   Highest education level: Not on file  Occupational History   Occupation: HEALTH CARE BILLING SPECILALIST    Employer: [employer]  Tobacco Use   Smoking status: Never   Smokeless tobacco: Never  Vaping Use   Vaping status: Never Used  Substance and Sexual Activity   Alcohol use: No    Alcohol/week: 0.0 standard drinks of alcohol   Drug use: No   Sexual activity: Never    Comment: Virgin  Other Topics Concern   Not on file  Social History Narrative   Single, lives with her mother   Works at [employer] as a Holiday representative   No alcohol tobacco or drug use   Social Drivers of Corporate investment banker Strain: Not on file  Food Insecurity: Not on file  Transportation Needs: Not on file  Physical Activity: Not on file  Stress: Not on file  Social Connections: Not on file  Intimate Partner Violence: Not on file  Family History  Problem Relation Age of Onset   Osteoporosis Mother    Colon polyps Maternal Aunt    Hypertension Maternal Grandfather    Breast cancer Maternal Grandmother    Colon cancer Neg Hx    Esophageal cancer Neg Hx    Stomach cancer Neg Hx    Allergies  Allergen Reactions   Bee Venom Swelling    BEE STINGS     PE Today's Vitals   03/03/24 1541  BP: 98/64  Pulse: 66  Temp: 97.7 F (36.5 C)  TempSrc: Oral  SpO2: 98%  Weight: 121 lb (54.9 kg)  Height: 5' 6.5 (1.689 m)   Body mass index is 19.24 kg/m.  Physical Exam Vitals reviewed. Exam conducted with a chaperone present.  Constitutional:      General: She is not in acute distress.    Appearance: Normal appearance.  HENT:     Head: Normocephalic  and atraumatic.     Nose: Nose normal.  Eyes:     Extraocular Movements: Extraocular movements intact.     Conjunctiva/sclera: Conjunctivae normal.  Neck:     Thyroid: No thyroid mass, thyromegaly or thyroid tenderness.  Pulmonary:     Effort: Pulmonary effort is normal.  Chest:     Chest wall: No mass or tenderness.  Breasts:    Right: Normal. No swelling, mass, nipple discharge, skin change or tenderness.     Left: Normal. No swelling, mass, nipple discharge, skin change or tenderness.  Abdominal:     General: There is no distension.     Palpations: Abdomen is soft.     Tenderness: There is no abdominal tenderness.  Genitourinary:    General: Normal vulva.     Exam position: Lithotomy position.     Urethra: No prolapse.     Vagina: No vaginal discharge.     Comments: Deferred internal exam 2cm erythematous patch of external left labia minora that blanches Musculoskeletal:        General: Normal range of motion.     Cervical back: Normal range of motion.  Lymphadenopathy:     Upper Body:     Right upper body: No axillary adenopathy.     Left upper body: No axillary adenopathy.     Lower Body: No right inguinal adenopathy. No left inguinal adenopathy.  Skin:    General: Skin is warm and dry.  Neurological:     General: No focal deficit present.     Mental Status: She is alert.  Psychiatric:        Mood and Affect: Mood normal.        Behavior: Behavior normal.       Assessment and Plan:        Well woman exam with routine gynecological exam Assessment & Plan: Cervical cancer screening performed according to ASCCP guidelines. Encouraged annual mammogram screening Colonoscopy UTD DXA, consider next year given hx of POI Labs and immunizations with her primary Encouraged safe sexual practices as indicated Encouraged healthy lifestyle practices with diet and exercise For patients under 50-70yo, I recommend 1200mg  calcium daily and 600IU of vitamin D   daily.   Orders: -     VITAMIN D  25 Hydroxy (Vit-D Deficiency, Fractures); Future -     Thyroid Panel With TSH; Future -     CBC; Future -     Ambulatory Referral to Primary Care (Establish Care) -     Comprehensive metabolic panel with GFR; Future -     US  PELVIS (TRANSABDOMINAL  ONLY); Future  Vulvodynia Assessment & Plan: Hx of vulvodynia with painful pelvic exams per prior GYN She has used topical lidocaine for this in the past, however her last pelvic exam was unsuccessful. Dr. Lavoie, GYN, offered ultrasounds as opposed to pelvic exams, patient opted out of transvaginal ultrasound for 2025 annual We discussed indications for pelvic exams during her annual visits, which would include concerning pelvic symptoms and routine cervical cancer screening. We will otherwise defer routine pelvic exams at this time. She will look into insurance coverage for cost of pelvic ultrasound during next year's annual exam.    Vera LULLA Pa, MD

## 2024-03-16 DIAGNOSIS — N94819 Vulvodynia, unspecified: Secondary | ICD-10-CM | POA: Insufficient documentation

## 2024-03-16 DIAGNOSIS — Z01419 Encounter for gynecological examination (general) (routine) without abnormal findings: Secondary | ICD-10-CM | POA: Insufficient documentation

## 2024-03-16 NOTE — Assessment & Plan Note (Addendum)
 Hx of vulvodynia with painful pelvic exams per prior GYN She has used topical lidocaine for this in the past, however her last pelvic exam was unsuccessful. Dr. Lavoie, GYN, offered ultrasounds as opposed to pelvic exams, patient opted out of transvaginal ultrasound for 2025 annual We discussed indications for pelvic exams during her annual visits, which would include concerning pelvic symptoms and routine cervical cancer screening. We will otherwise defer routine pelvic exams at this time. She will look into insurance coverage for cost of pelvic ultrasound during next year's annual exam.

## 2024-03-16 NOTE — Assessment & Plan Note (Signed)
 Cervical cancer screening performed according to ASCCP guidelines. Encouraged annual mammogram screening Colonoscopy UTD DXA, consider next year given hx of POI Labs and immunizations with her primary Encouraged safe sexual practices as indicated Encouraged healthy lifestyle practices with diet and exercise For patients under 50-55yo, I recommend 1200mg  calcium daily and 600IU of vitamin D  daily.

## 2024-05-11 ENCOUNTER — Telehealth: Payer: Self-pay

## 2024-05-11 DIAGNOSIS — Z23 Encounter for immunization: Secondary | ICD-10-CM

## 2024-05-11 MED ORDER — MODERNA COVID-19 VACCINE 100 MCG/0.5ML IM SUSP: 0.5000 mL | Freq: Once | INTRAMUSCULAR | 0 refills | Status: AC

## 2024-05-11 NOTE — Telephone Encounter (Signed)
 Prescription placed for moderna vaccine, which she can get if she has received the Moderna or Pfizer vaccine previously. She will need her vaccine records when she presents to [facility]. Please have her send in her updated records for our file.

## 2024-05-11 NOTE — Telephone Encounter (Signed)
 Patient called & stated that the new laws say that if a person wants the covid 19 vaccine that their provider has to send in rx for one. She states she will be seeing a new pcp but that will not be until February. She is asking if you can send it into her [facility] pharmacy.  Patient will like a call back so she will know if it was sent to the pharmacy or if she needs to come pick up the prescription.

## 2024-05-12 NOTE — Telephone Encounter (Signed)
 Patient notified. Encounter closed

## 2024-10-14 ENCOUNTER — Ambulatory Visit: Admitting: Physician Assistant
